# Patient Record
Sex: Female | Born: 1970 | Race: White | Hispanic: No | Smoking: Never smoker
Health system: Southern US, Community
[De-identification: ages and names within clinical notes are randomized; demographics above are authoritative.]

## PROBLEM LIST (undated history)

## (undated) DIAGNOSIS — N83209 Unspecified ovarian cyst, unspecified side: Secondary | ICD-10-CM

## (undated) DIAGNOSIS — R51 Headache: Secondary | ICD-10-CM

## (undated) DIAGNOSIS — M797 Fibromyalgia: Secondary | ICD-10-CM

## (undated) DIAGNOSIS — K8020 Calculus of gallbladder without cholecystitis without obstruction: Secondary | ICD-10-CM

## (undated) DIAGNOSIS — M1990 Unspecified osteoarthritis, unspecified site: Secondary | ICD-10-CM

## (undated) DIAGNOSIS — J189 Pneumonia, unspecified organism: Secondary | ICD-10-CM

## (undated) DIAGNOSIS — K219 Gastro-esophageal reflux disease without esophagitis: Secondary | ICD-10-CM

## (undated) HISTORY — DX: Fibromyalgia: M79.7

## (undated) HISTORY — DX: Pneumonia, unspecified organism: J18.9

## (undated) HISTORY — DX: Calculus of gallbladder without cholecystitis without obstruction: K80.20

## (undated) HISTORY — DX: Unspecified osteoarthritis, unspecified site: M19.90

## (undated) HISTORY — DX: Gastro-esophageal reflux disease without esophagitis: K21.9

---

## 2000-02-04 ENCOUNTER — Inpatient Hospital Stay (HOSPITAL_COMMUNITY): Admission: AD | Admit: 2000-02-04 | Discharge: 2000-02-06 | Payer: Self-pay | Admitting: *Deleted

## 2000-04-21 ENCOUNTER — Other Ambulatory Visit: Admission: RE | Admit: 2000-04-21 | Discharge: 2000-04-21 | Payer: Self-pay | Admitting: Obstetrics and Gynecology

## 2003-05-19 ENCOUNTER — Emergency Department (HOSPITAL_COMMUNITY): Admission: EM | Admit: 2003-05-19 | Discharge: 2003-05-19 | Payer: Self-pay | Admitting: *Deleted

## 2005-01-19 ENCOUNTER — Other Ambulatory Visit: Admission: RE | Admit: 2005-01-19 | Discharge: 2005-01-19 | Payer: Self-pay | Admitting: Obstetrics and Gynecology

## 2009-10-16 ENCOUNTER — Encounter: Admission: RE | Admit: 2009-10-16 | Discharge: 2009-10-16 | Payer: Self-pay | Admitting: Obstetrics and Gynecology

## 2009-10-28 ENCOUNTER — Encounter: Admission: RE | Admit: 2009-10-28 | Discharge: 2009-10-28 | Payer: Self-pay | Admitting: Obstetrics and Gynecology

## 2010-08-15 ENCOUNTER — Ambulatory Visit (HOSPITAL_COMMUNITY): Admission: RE | Admit: 2010-08-15 | Discharge: 2010-08-15 | Payer: Self-pay | Admitting: Family Medicine

## 2010-08-15 ENCOUNTER — Encounter: Payer: Self-pay | Admitting: Orthopedic Surgery

## 2010-09-22 ENCOUNTER — Encounter (INDEPENDENT_AMBULATORY_CARE_PROVIDER_SITE_OTHER): Payer: Self-pay | Admitting: *Deleted

## 2010-09-22 ENCOUNTER — Ambulatory Visit: Payer: Self-pay | Admitting: Orthopedic Surgery

## 2010-09-22 ENCOUNTER — Telehealth: Payer: Self-pay | Admitting: Orthopedic Surgery

## 2010-09-22 DIAGNOSIS — M542 Cervicalgia: Secondary | ICD-10-CM

## 2010-09-24 ENCOUNTER — Encounter
Admission: RE | Admit: 2010-09-24 | Discharge: 2010-12-02 | Payer: Self-pay | Source: Home / Self Care | Attending: Orthopedic Surgery | Admitting: Orthopedic Surgery

## 2010-11-05 ENCOUNTER — Encounter: Payer: Self-pay | Admitting: Orthopedic Surgery

## 2010-11-12 ENCOUNTER — Encounter (INDEPENDENT_AMBULATORY_CARE_PROVIDER_SITE_OTHER): Payer: Self-pay | Admitting: *Deleted

## 2010-11-25 ENCOUNTER — Ambulatory Visit: Payer: Self-pay | Admitting: Orthopedic Surgery

## 2010-11-25 DIAGNOSIS — M5020 Other cervical disc displacement, unspecified cervical region: Secondary | ICD-10-CM

## 2010-12-02 ENCOUNTER — Encounter (INDEPENDENT_AMBULATORY_CARE_PROVIDER_SITE_OTHER): Payer: Self-pay | Admitting: *Deleted

## 2010-12-04 ENCOUNTER — Ambulatory Visit (HOSPITAL_COMMUNITY)
Admission: RE | Admit: 2010-12-04 | Discharge: 2010-12-04 | Payer: Self-pay | Source: Home / Self Care | Attending: Orthopedic Surgery | Admitting: Orthopedic Surgery

## 2010-12-10 ENCOUNTER — Ambulatory Visit
Admission: RE | Admit: 2010-12-10 | Discharge: 2010-12-10 | Payer: Self-pay | Source: Home / Self Care | Attending: Orthopedic Surgery | Admitting: Orthopedic Surgery

## 2010-12-10 DIAGNOSIS — M47812 Spondylosis without myelopathy or radiculopathy, cervical region: Secondary | ICD-10-CM | POA: Insufficient documentation

## 2010-12-28 ENCOUNTER — Encounter: Payer: Self-pay | Admitting: Obstetrics and Gynecology

## 2011-01-08 NOTE — Miscellaneous (Signed)
Summary: mri c spine aph 12/04/10 reg 430pm  Clinical Lists Changes  no precert needed for [facility], pt to bring disc for fu appt on 12/10/10, pt aware

## 2011-01-08 NOTE — Miscellaneous (Signed)
Summary: PT initial evaluation  PT initial evaluation   Imported By: Jacklynn Ganong 11/06/2010 09:16:00  _____________________________________________________________________  External Attachment:    Type:   Image     Comment:   External Document

## 2011-01-08 NOTE — Assessment & Plan Note (Signed)
Summary: reck after pt/medcost/bsf   Visit Type:  Follow-up Referring Connie Lasater:  Robbie Lis Primary Deneka Greenwalt:  Robbie Lis medical  CC:  recheck neck.  History of Present Illness: 40 year old  female here for followup. [id number] possible herniated disc  Treatment ibuprofen 803 times a day, steroid Dosepak physical therapy  Imaging cervical spine x-ray September 9 of 2011 at the local hospital showed no cervical spine disease just loss of lordosis  Patient notes continued pain at the mid cervical spine with mild improvement in the related symptoms from physical therapy  Today scheduled for reevaluation after therapy  Review of systems when the patient is filing the long finger and ring finger will go numb on occasion.  She also has bilateral shoulder pain and bilateral knee pain which she relates to her arthritis  Physical examination reveals improved range of motion in the cervical spine with tenderness continue at the C4-C5 level.       Allergies: No Known Drug Allergies   Impression & Recommendations:  Problem # 1:  NECK PAIN (ICD-723.1) Assessment Unchanged  Orders: Est. 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 Patient Level III (21308)  Patient Instructions: 1)  return with results after MRI   Orders Added: 1)  Est. Patient Level III [65784]

## 2011-01-08 NOTE — Assessment & Plan Note (Signed)
Summary: NECK PAIN XR AT [facility] 08/15/10/[facility]/J SUAREZ/BSF   Vital Signs:  Patient profile:   40 year old female Height:      64.5 inches Weight:      285 pounds  Visit Type:  new patient Referring Provider:  Robbie Lis Primary Provider:  Robbie Lis medical  CC:  neck pain.  History of Present Illness: I saw Kathleen Massey in the office today for an initial visit.  She is a 40 years old woman with the complaint of:  neck pain.  Meds: Advil cold and sinus,Robaxin during day, Flexeril at night.  Has also had Steroid dose pak.  No PT.  C spine xrays 08/15/10 [facility] for review.  This is a 40 year old female with severe pain at the base of her cervical spine radiating down both sides of her neck and hurting now in the upper side of her RIGHT posterior shoulder with pain when she coughs or sneezes for the last 8 months  Pain is constant pain is 6/10 pain hurts all day and night pain came on suddenly.  Pain improved ibuprofen ice and heat worse with stretching and flexing laterally and rolling over in bed.  Previous treatment included steroids Robaxin Flexeril.  No physical therapy has been ordered or done.    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 Cardiovascular:  Denies chest pain, palpitations, fainting, and murmurs. Respiratory:  Denies short of breath, wheezing, couch, tightness, pain on inspiration, and snoring . Gastrointestinal:  Denies heartburn, nausea, vomiting, diarrhea, constipation, and blood in your stools. Genitourinary:  Denies frequency, urgency, difficulty urinating, painful urination, flank pain, and bleeding in  urine. Neurologic:  Denies numbness, tingling, unsteady gait, dizziness, tremors, and seizure. Musculoskeletal:  Complains of joint pain and muscle pain; denies swelling, instability, stiffness, redness, and heat. Endocrine:  Denies excessive thirst, exessive urination, and heat or cold intolerance. Psychiatric:  Denies nervousness, depression, anxiety, and hallucinations. Skin:  Denies changes in the skin, poor healing, rash, itching, and redness. HEENT:  Denies blurred or double vision, eye pain, redness, and watering. Immunology:  Complains of seasonal allergies; denies sinus problems and allergic to bee stings. Hemoatologic:  Denies easy bleeding and brusing.  Physical Exam  Additional Exam:  Slightly overweight female with normal development grooming and hygiene.  Vital signs as recorded stable.  Cardiovascular exam reveals no abnormalities on observational palpation  Lymph nodes are negative in the cervical spine  Skin of the shoulders neck normal  She is normal sensation normal reflexes normal coordination no pathologic signs.  She's awake alert and oriented x3 mood and affect are normal  Ambulation is normal  She does have tenderness at the base of the cervical spine and in the trapeze use muscles.  She has mild stiffness on flexion-extension rotation and lateral flexion  Her extremities have 5 over 5 strength in all muscles.  Her neck and shoulders are stable.  She has negative impingement sign and no apprehension.     Impression & Recommendations:  Problem # 1:  NECK PAIN (ICD-723.1) Assessment New previous x-rays including a cervical spine show no evidence of disc disease joint space [place] but there is some reversal of the cervical orthosis  in the mid to upper cervical spine suggesting muscle spasm.  This was done September of this year.   her exam is negative except for some stiffness.  I suggest she continue her medications and then come back for followup  evaluation after some physical therapy  Orders: Physical Therapy Referral (PT) New Patient Level III (45409)  Patient Instructions: 1)  Go for PT at [place] 2)  Continue medications 3)  come back in 6 weeks   Orders Added: 1)  Physical Therapy Referral [PT] 2)  New Patient Level III [81191]

## 2011-01-08 NOTE — Letter (Signed)
Summary: *Orthopedic No Show Letter  [facility]  [address]  [place], [place] [address]   Phone: [phone]  Fax: [phone]    11/12/2010     Kathleen Massey [address] [place], [place]       Dear Ms. Fuerstenberg,   Our records indicate that you missed your scheduled appointment with Dr. Beaulah Corin on November 11,2011.  Please contact this office to reschedule your appointment as soon as possible.  It is important that you keep your scheduled appointments with your physician, so we can provide you the best care possible.        Sincerely,   Dr. Terrance Mass, MD [facility] Phone [phone]

## 2011-01-08 NOTE — Letter (Signed)
Summary: Out of Work  [facility]  [address]  [place], [place] [address]   Phone: [phone]  Fax: [phone]    September 22, 2010   Employee:  EVELISSE SZALKOWSKI    To Whom It May Concern:   For Medical reasons, please excuse the above named employee from work for the following dates:  Start:   09/22/10          Patient was seen in our office today for an afternoon appointment  End/Return to full-duty work:   09/23/10  If you need additional information, please feel free to contact our office.         Sincerely,    Terrance Mass, MD

## 2011-01-08 NOTE — Letter (Signed)
Summary: *Orthopedic No Show Letter  [facility]  [address]  [place], [place] [address]   Phone: [phone]  Fax: [phone]    11/12/2010   Dear Ms. Hartel,   Our records indicate that you missed your scheduled appointment with Dr. Beaulah Corin on October 17 2010.  Please contact this office to reschedule your appointment as soon as possible.  It is important that you keep your scheduled appointments with your physician, so we can provide you the best care possible.        Sincerely,   Dr. Terrance Mass, MD [facility] Phone [phone]

## 2011-01-08 NOTE — Assessment & Plan Note (Signed)
Summary: mri results c spine/to bring disc/[facility].cbt   Visit Type:  Follow-up Referring Provider:  Robbie Lis Primary Provider:  Robbie Lis medical  CC:  mri results c spine.  History of Present Illness: 40 year old  female here for followup. [id number] possible herniated disc  Treatment ibuprofen 800 3 times a day, steroid Dosepak, physical therapy  Other medications include Robaxin and Flexeril the Flexeril seemed to work better but it makes her very drowsy so we are only taken at night.  10 mg Flexeril q.h.s.11  Imaging cervical spine x-ray September 9 of 2011 at the local hospital showed no cervical spine disease just loss of lordosis  Patient notes continued pain at the mid cervical spine with mild improvement in the related symptoms from physical therapy  Today is recheck and MRI results taken [facility] 12/04/10 C spine.  Pain level is the same, Pain around 3 today.  IMPRESSION: Mild degenerative changes.  Negative for disc protrusion or significant stenosis.          Allergies: No Known Drug Allergies  Review of Systems       occasional numbness tingling and after extremity seems to be related to activity such as filing   Impression & Recommendations:  Problem # 1:  NECK PAIN (ICD-723.1) Assessment Unchanged The MRI was done at [facility] and it was reviewed with the report    After reviewing this with the patient and giving her [employer] cervical spondylosis information sheet we decided to try some massage therapy for the neck and shoulders, continue physical therapy, continue Flexeril and ibuprofen followup one month.  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 Patient Level III (11914)  Patient Instructions: 1)  Please schedule a follow-up appointment in 1 month.   Orders Added: 1)  Est. Patient Level III [78295]

## 2011-01-08 NOTE — Progress Notes (Signed)
Summary: fax# to follow to send work note if needed  Phone Note Call from Patient   Caller: Patient Summary of Call: Patient will call back w/fax # for work note to be sent to her direct fax. Initial call taken by: Cammie Sickle,  September 22, 2010 4:20 PM

## 2011-01-08 NOTE — Miscellaneous (Signed)
Summary: Physical therapy order [facility] [address]  Physical therapy order [facility] [address]   Imported By: Cammie Sickle 09/22/2010 18:14:19  _____________________________________________________________________  External Attachment:    Type:   Image     Comment:   External Document

## 2011-01-08 NOTE — Miscellaneous (Signed)
Summary: PT progress note  PT progress note   Imported By: Jacklynn Ganong 11/26/2010 07:46:17  _____________________________________________________________________  External Attachment:    Type:   Image     Comment:   External Document

## 2011-01-08 NOTE — Letter (Signed)
Summary: History form  History form   Imported By: Jacklynn Ganong 10/01/2010 15:24:10  _____________________________________________________________________  External Attachment:    Type:   Image     Comment:   External Document

## 2011-01-20 ENCOUNTER — Telehealth: Payer: Self-pay | Admitting: Orthopedic Surgery

## 2011-01-21 ENCOUNTER — Ambulatory Visit: Payer: Self-pay | Admitting: Orthopedic Surgery

## 2011-01-28 NOTE — Progress Notes (Signed)
Summary: patient needs to cancel appointment  Phone Note Call from Patient   Caller: Patient Summary of Call: Patient called to first cancel tomorrow's appointment (01/21/11) due to work schedule.  States neck and back feeling much better, "basically pain-free" with taking medication as instructed.  Elected to hold on re-scheduling appointment; asking if needs to re-schedule at all.  Please advise. Initial call taken by: Cammie Sickle,  January 20, 2011 11:10 AM  Follow-up for Phone Call        if she needs [place] call us Follow-up by: Ether Griffins,  January 20, 2011 11:19 AM  Additional Follow-up for Phone Call Additional follow up Details #1::        called patient. (had tried work ph# earlier, not at work today. called home ph#; left voice message to return call to advise. Additional Follow-up by: Cammie Sickle,  January 20, 2011 3:32 PM

## 2011-07-09 ENCOUNTER — Other Ambulatory Visit: Payer: Self-pay | Admitting: Obstetrics and Gynecology

## 2011-07-09 DIAGNOSIS — Z1231 Encounter for screening mammogram for malignant neoplasm of breast: Secondary | ICD-10-CM

## 2011-07-21 ENCOUNTER — Ambulatory Visit: Payer: PRIVATE HEALTH INSURANCE

## 2012-04-12 ENCOUNTER — Emergency Department (HOSPITAL_COMMUNITY)
Admission: EM | Admit: 2012-04-12 | Discharge: 2012-04-12 | Disposition: A | Discharge status: Home / Self Care | Payer: PRIVATE HEALTH INSURANCE | Attending: Emergency Medicine | Admitting: Emergency Medicine

## 2012-04-12 ENCOUNTER — Encounter (HOSPITAL_COMMUNITY): Payer: Self-pay

## 2012-04-12 DIAGNOSIS — R079 Chest pain, unspecified: Secondary | ICD-10-CM | POA: Insufficient documentation

## 2012-04-12 DIAGNOSIS — R51 Headache: Secondary | ICD-10-CM | POA: Insufficient documentation

## 2012-04-12 MED ORDER — ONDANSETRON HCL 4 MG/2ML IJ SOLN
4.0000 mg | Freq: Once | INTRAMUSCULAR | Status: AC
  Administered 2012-04-12: 4 mg via INTRAVENOUS
  Filled 2012-04-12: qty 2

## 2012-04-12 MED ORDER — KETOROLAC TROMETHAMINE 30 MG/ML IJ SOLN
30.0000 mg | Freq: Once | INTRAMUSCULAR | Status: AC
  Administered 2012-04-12: 30 mg via INTRAVENOUS
  Filled 2012-04-12: qty 1

## 2012-04-12 MED ORDER — METOCLOPRAMIDE HCL 5 MG/ML IJ SOLN
10.0000 mg | Freq: Once | INTRAMUSCULAR | Status: AC
  Administered 2012-04-12: 10 mg via INTRAVENOUS
  Filled 2012-04-12: qty 2

## 2012-04-12 MED ORDER — SODIUM CHLORIDE 0.9 % IV BOLUS (SEPSIS)
1000.0000 mL | Freq: Once | INTRAVENOUS | Status: AC
  Administered 2012-04-12: 1000 mL via INTRAVENOUS

## 2012-04-12 NOTE — ED Notes (Signed)
Pt c/o severe headache x 3 days.  Reports substernal chest pain going through to back since this am.   Pt went to PCP's office at 1230 and chest pain started going away.  Dr. Phillips Odor reports pt's bp was 135 /98 and  143/107 in the office.  Says pt is usually normotensive.

## 2012-04-12 NOTE — Discharge Instructions (Signed)
Followup your primary care Dr.      Raelene Bott a blood pressure log to analyze the trend of your blood pressure.  No blood pressure medication recommended at this point

## 2012-04-12 NOTE — ED Provider Notes (Signed)
History     CSN: [id number]  Arrival date & time 04/12/12  1356   First MD Initiated Contact with Patient 04/12/12 1401      Chief Complaint  Patient presents with  . Headache  . Chest Pain    (Consider location/radiation/quality/duration/timing/severity/associated sxs/prior treatment) HPI....Marland Kitchen complains of frontal headache radiating to the occipital area for 2-3 days. No neurological deficits, fever, chills, stiff neck.  Additionally patient had fleeting chest pain. She went to her primary care Dr. today and her blood pressure was 143/107.  He states it is normally normal.  No dyspnea, nausea, diaphoresis. Nothing makes symptoms better or worse. Described as minimal to moderate  History reviewed. No pertinent past medical history.  Past Surgical History  Procedure Date  . Cesarean section     No family history on file.  History  Substance Use Topics  . Smoking status: Never Smoker   . Smokeless tobacco: Not on file  . Alcohol Use: No    OB History    Grav Para Term Preterm Abortions TAB SAB Ect Mult Living                  Review of Systems  All other systems reviewed and are negative.    Allergies  Review of patient's allergies indicates no known allergies.  Home Medications  No current outpatient prescriptions on file.  LMP 04/02/2012  Physical Exam  Nursing note and vitals reviewed. Constitutional: She is oriented to person, place, and time. She appears well-developed and well-nourished.       Obese  HENT:  Head: Normocephalic and atraumatic.  Eyes: Conjunctivae and EOM are normal. Pupils are equal, round, and reactive to light.  Neck: Normal range of motion. Neck supple.  Cardiovascular: Normal rate and regular rhythm.   Pulmonary/Chest: Effort normal and breath sounds normal.  Abdominal: Soft. Bowel sounds are normal.  Musculoskeletal: Normal range of motion.  Neurological: She is alert and oriented to person, place, and time.  Skin: Skin is warm  and dry.  Psychiatric: She has a normal mood and affect.    ED Course  Procedures (including critical care time)  Labs Reviewed - No data to display No results found.   No diagnosis found.   Date: 04/12/2012  Rate: 75  Rhythm: normal sinus rhythm  QRS Axis: normal  Intervals: normal  ST/T Wave abnormalities: normal  Conduction Disutrbances: none  Narrative Interpretation: unremarkable     MDM  Patient is normal physical exam. Feeling much better after IV fluids, IV Toradol, Zofran, Reglan. Can followup with primary care        Donnetta Hutching, MD 04/12/12 [phone]

## 2012-12-14 ENCOUNTER — Inpatient Hospital Stay (HOSPITAL_COMMUNITY)
Admission: AD | Admit: 2012-12-14 | Discharge: 2012-12-14 | Disposition: A | Discharge status: Home / Self Care | Payer: PRIVATE HEALTH INSURANCE | Source: Ambulatory Visit | Attending: Obstetrics and Gynecology | Admitting: Obstetrics and Gynecology

## 2012-12-14 ENCOUNTER — Encounter (HOSPITAL_COMMUNITY): Payer: Self-pay | Admitting: Advanced Practice Midwife

## 2012-12-14 DIAGNOSIS — N938 Other specified abnormal uterine and vaginal bleeding: Secondary | ICD-10-CM | POA: Insufficient documentation

## 2012-12-14 DIAGNOSIS — N949 Unspecified condition associated with female genital organs and menstrual cycle: Secondary | ICD-10-CM | POA: Insufficient documentation

## 2012-12-14 HISTORY — DX: Headache: R51

## 2012-12-14 HISTORY — DX: Unspecified ovarian cyst, unspecified side: N83.209

## 2012-12-14 LAB — CBC
HCT: 39.7 % (ref 36.0–46.0)
MCH: 30.8 pg (ref 26.0–34.0)
MCHC: 34 g/dL (ref 30.0–36.0)
MCV: 90.6 fL (ref 78.0–100.0)
Platelets: 324 10*3/uL (ref 150–400)
RBC: 4.38 MIL/uL (ref 3.87–5.11)
RDW: 12.6 % (ref 11.5–15.5)
WBC: 7.9 10*3/uL (ref 4.0–10.5)

## 2012-12-14 LAB — POCT PREGNANCY, URINE: Preg Test, Ur: NEGATIVE

## 2012-12-14 LAB — URINE MICROSCOPIC-ADD ON

## 2012-12-14 LAB — URINALYSIS, ROUTINE W REFLEX MICROSCOPIC
Bilirubin Urine: NEGATIVE
Specific Gravity, Urine: 1.015 (ref 1.005–1.030)

## 2012-12-14 LAB — WET PREP, GENITAL: Clue Cells Wet Prep HPF POC: NONE SEEN

## 2012-12-14 NOTE — MAU Provider Note (Signed)
History     CSN: [id number]  Arrival date and time: 12/14/12 1713   None     Chief Complaint  Patient presents with  . Vaginal Bleeding   HPI 42 y.o. [id number] with heavy vaginal bleeding x 12 days. No menstrual cycle x 3 months prior to this. Periods have been irregular this year, but not usually heavy. Also c/o headaches.    Past Medical History  Diagnosis Date  . Ovarian cyst   . Headache     Past Surgical History  Procedure Date  . Cesarean section     Family History  Problem Relation Age of Onset  . Other Neg Hx     History  Substance Use Topics  . Smoking status: Never Smoker   . Smokeless tobacco: Not on file  . Alcohol Use: No    Allergies: No Known Allergies  No prescriptions prior to admission    Review of Systems  Constitutional: Negative.   Respiratory: Negative.   Cardiovascular: Negative.   Gastrointestinal: Negative for nausea, vomiting, abdominal pain, diarrhea and constipation.  Genitourinary: Negative for dysuria, urgency, frequency, hematuria and flank pain.       Positive bleeding   Musculoskeletal: Negative.   Neurological: Positive for headaches.  Psychiatric/Behavioral: Negative.    Physical Exam   Blood pressure 127/76, pulse 75, temperature 97.7 F (36.5 C), temperature source Oral, resp. rate 16, height 5' 5.5" (1.664 m), weight 278 lb (126.1 kg), last menstrual period 12/02/2012.  Physical Exam  Nursing note and vitals reviewed. Constitutional: She is oriented to person, place, and time. She appears well-developed and well-nourished. No distress.       Obese   Cardiovascular: Normal rate.   Respiratory: Effort normal.  Genitourinary: There is no rash, tenderness or lesion on the right labia. There is no rash, tenderness or lesion on the left labia. Uterus is not enlarged and not tender. Cervix exhibits no motion tenderness and no friability. Right adnexum displays no mass, no tenderness and no fullness. Left adnexum  displays no mass, no tenderness and no fullness. There is bleeding (moderate) around the vagina.       Exam limited by body habitus   Musculoskeletal: Normal range of motion.  Neurological: She is alert and oriented to person, place, and time.  Skin: Skin is warm and dry.  Psychiatric: She has a normal mood and affect.    MAU Course  Procedures  Results for orders placed during the hospital encounter of 12/14/12 (from the past 24 hour(s))  URINALYSIS, ROUTINE W REFLEX MICROSCOPIC     Status: Abnormal   Collection Time   12/14/12  5:25 PM      Component Value Range   Color, Urine YELLOW  YELLOW   APPearance CLOUDY (*) CLEAR   Specific Gravity, Urine 1.015  1.005 - 1.030   pH 6.5  5.0 - 8.0   Glucose, UA NEGATIVE  NEGATIVE mg/dL   Hgb urine dipstick LARGE (*) NEGATIVE   Bilirubin Urine NEGATIVE  NEGATIVE   Ketones, ur NEGATIVE  NEGATIVE mg/dL   Protein, ur NEGATIVE  NEGATIVE mg/dL   Urobilinogen, UA 0.2  0.0 - 1.0 mg/dL   Nitrite NEGATIVE  NEGATIVE   Leukocytes, UA TRACE (*) NEGATIVE  URINE MICROSCOPIC-ADD ON     Status: Abnormal   Collection Time   12/14/12  5:25 PM      Component Value Range   Squamous Epithelial / LPF FEW (*) RARE   WBC, UA 0-2  <3 WBC/hpf  RBC / HPF TOO NUMEROUS TO COUNT  <3 RBC/hpf   Bacteria, UA FEW (*) RARE  POCT PREGNANCY, URINE     Status: Normal   Collection Time   12/14/12  5:37 PM      Component Value Range   Preg Test, Ur NEGATIVE  NEGATIVE  WET PREP, GENITAL     Status: Abnormal   Collection Time   12/14/12  6:00 PM      Component Value Range   Yeast Wet Prep HPF POC NONE SEEN  NONE SEEN   Trich, Wet Prep NONE SEEN  NONE SEEN   Clue Cells Wet Prep HPF POC NONE SEEN  NONE SEEN   WBC, Wet Prep HPF POC FEW (*) NONE SEEN  CBC     Status: Normal   Collection Time   12/14/12  6:15 PM      Component Value Range   WBC 7.9  4.0 - 10.5 K/uL   RBC 4.38  3.87 - 5.11 MIL/uL   Hemoglobin 13.5  12.0 - 15.0 g/dL   HCT 16.1  09.6 - 04.5 %   MCV 90.6  78.0  - 100.0 fL   MCH 30.8  26.0 - 34.0 pg   MCHC 34.0  30.0 - 36.0 g/dL   RDW 40.9  81.1 - 91.4 %   Platelets 324  150 - 400 K/uL     Assessment and Plan   1. DUB (dysfunctional uterine bleeding)       Medication List     As of 12/14/2012  7:06 PM    CONTINUE taking these medications         ZANTAC 150 MG tablet   Generic drug: ranitidine      ASK your doctor about these medications         ALPRAZolam 0.5 MG tablet   Commonly known as: XANAX      diclofenac 75 MG EC tablet   Commonly known as: VOLTAREN      ibuprofen 200 MG tablet   Commonly known as: ADVIL,MOTRIN            Follow-up Information    Follow up with Levi Aland, MD. (as scheduled on Friday)    Contact information:   [address] [place] [address] [phone]            Colisha Redler 12/14/2012, 7:04 PM

## 2012-12-14 NOTE — MAU Note (Addendum)
C/o missed 3 periods but now has been bleeding for 15 days; hx of ovarian cyst;

## 2012-12-15 LAB — GC/CHLAMYDIA PROBE AMP: GC Probe RNA: NEGATIVE

## 2013-03-04 ENCOUNTER — Inpatient Hospital Stay (HOSPITAL_COMMUNITY)
Admission: AD | Admit: 2013-03-04 | Discharge: 2013-03-05 | Disposition: A | Discharge status: Home / Self Care | Payer: PRIVATE HEALTH INSURANCE | Source: Ambulatory Visit | Attending: Obstetrics and Gynecology | Admitting: Obstetrics and Gynecology

## 2013-03-04 DIAGNOSIS — R1032 Left lower quadrant pain: Secondary | ICD-10-CM

## 2013-03-04 DIAGNOSIS — R109 Unspecified abdominal pain: Secondary | ICD-10-CM | POA: Insufficient documentation

## 2013-03-04 DIAGNOSIS — N920 Excessive and frequent menstruation with regular cycle: Secondary | ICD-10-CM | POA: Insufficient documentation

## 2013-03-04 DIAGNOSIS — N949 Unspecified condition associated with female genital organs and menstrual cycle: Secondary | ICD-10-CM | POA: Insufficient documentation

## 2013-03-04 DIAGNOSIS — K219 Gastro-esophageal reflux disease without esophagitis: Secondary | ICD-10-CM | POA: Insufficient documentation

## 2013-03-04 DIAGNOSIS — N926 Irregular menstruation, unspecified: Secondary | ICD-10-CM | POA: Insufficient documentation

## 2013-03-05 ENCOUNTER — Encounter (HOSPITAL_COMMUNITY): Payer: Self-pay | Admitting: Obstetrics and Gynecology

## 2013-03-05 DIAGNOSIS — R1032 Left lower quadrant pain: Secondary | ICD-10-CM

## 2013-03-05 LAB — URINALYSIS, ROUTINE W REFLEX MICROSCOPIC
Leukocytes, UA: NEGATIVE
Nitrite: NEGATIVE
pH: 5 (ref 5.0–8.0)

## 2013-03-05 LAB — URINE MICROSCOPIC-ADD ON

## 2013-03-05 LAB — POCT PREGNANCY, URINE: Preg Test, Ur: NEGATIVE

## 2013-03-05 MED ORDER — IBUPROFEN 600 MG PO TABS: 600.0000 mg | ORAL_TABLET | Freq: Four times a day (QID) | ORAL | Status: DC | PRN

## 2013-03-05 NOTE — MAU Note (Signed)
Severe lower abdominal pain onset around 11:30 p.m, no N/V/D, denies vaginal bleeding, no dysuria, no constipation, history of ovarian cysts.

## 2013-03-05 NOTE — MAU Note (Signed)
Patient states that she does have periods but cannot tell me when her LMP was.

## 2013-03-05 NOTE — MAU Provider Note (Signed)
CC: Abdominal Pain    First Provider Initiated Contact with Patient 03/05/13 0048      HPI Kathleen Massey is a 42 y.o. [id number] who presents with abrupt onset at rest of left suprapubic pain about 30 minutes PTA. She describes it as feeling like something sharp scraping her insides and initially was doubled over in pain. At present she is having minimal discomfort. She took 3 Advils before coming in. Denies similar previous episodes although she did have a painful ovarian cyst in the past. She was seen here 2 months ago for irregular menses and an episode of menorrhagia. Cultures were negative. After that visit she was seen in the office and had a normal ultrasound. She was put on oral contraceptives to regulate her cycles however she self DC'd about 5 weeks ago due to headaches. Has had one normal period since then, but unsure where she is in her cycle now.  Past Medical History  Diagnosis Date  . Ovarian cyst   . Headache     OB History   Grav Para Term Preterm Abortions TAB SAB Ect Mult Living   4 3 3  1  1   3      # Outc Date GA Lbr Len/2nd Wgt Sex Del Anes PTL Lv   1 SAB            2 TRM            3 TRM            4 TRM               Past Surgical History  Procedure Laterality Date  . Cesarean section      History   Social History  . Marital Status: Married    Spouse Name: N/A    Number of Children: N/A  . Years of Education: 12 grade    Occupational History  . secretary/ receptionist     Social History Main Topics  . Smoking status: Never Smoker   . Smokeless tobacco: Not on file  . Alcohol Use: No  . Drug Use: No  . Sexually Active: Not on file   Other Topics Concern  . Not on file   Social History Narrative  . No narrative on file    No current facility-administered medications on file prior to encounter.   Current Outpatient Prescriptions on File Prior to Encounter  Medication Sig Dispense Refill  . ALPRAZolam (XANAX) 0.5 MG tablet Take 0.5 mg by  mouth at bedtime as needed.      . diclofenac (VOLTAREN) 75 MG EC tablet Take 75 mg by mouth 2 (two) times daily.      Marland Kitchen ibuprofen (ADVIL,MOTRIN) 200 MG tablet Take 800 mg by mouth every 6 (six) hours as needed.      . ranitidine (ZANTAC) 150 MG tablet Take 150 mg by mouth daily as needed. For acid reflux        No Known Allergies  ROS Bowel movements are normal. Denies flatulence, nausea, vomiting, constipation or diarrhea.  Denies dysuria, hematuria, frequency or urgency of urination. Denies irritative vaginal discharge.   PHYSICAL EXAM Filed Vitals:   03/05/13 0032  BP: 127/64  Pulse: 80  Temp: 98 F (36.7 C)  Resp: 18   General: Well nourished, well developed female in no acute distress Cardiovascular: Normal rate Respiratory: Normal effort Abdomen: Soft, nondistended, nontender throughout to deep palpation. No guarding or rebound. Back: No CVAT Extremities: No edema Neurologic: Alert  and oriented Bimanual exam:No CMT; uterus NSSP; no adnexal tenderness or masses  LAB RESULTS Results for orders placed during the hospital encounter of 03/04/13 (from the past 24 hour(s))  URINALYSIS, ROUTINE W REFLEX MICROSCOPIC     Status: Abnormal   Collection Time    03/05/13 12:10 AM      Result Value Range   Color, Urine YELLOW  YELLOW   APPearance CLEAR  CLEAR   Specific Gravity, Urine >1.030 (*) 1.005 - 1.030   pH 5.0  5.0 - 8.0   Glucose, UA NEGATIVE  NEGATIVE mg/dL   Hgb urine dipstick SMALL (*) NEGATIVE   Bilirubin Urine NEGATIVE  NEGATIVE   Ketones, ur NEGATIVE  NEGATIVE mg/dL   Protein, ur NEGATIVE  NEGATIVE mg/dL   Urobilinogen, UA 0.2  0.0 - 1.0 mg/dL   Nitrite NEGATIVE  NEGATIVE   Leukocytes, UA NEGATIVE  NEGATIVE  URINE MICROSCOPIC-ADD ON     Status: None   Collection Time    03/05/13 12:10 AM      Result Value Range   Squamous Epithelial / LPF RARE  RARE   WBC, UA 0-2  <3 WBC/hpf   RBC / HPF 0-2  <3 RBC/hpf   Bacteria, UA RARE  RARE   Urine-Other MUCOUS  PRESENT    POCT PREGNANCY, URINE     Status: None   Collection Time    03/05/13 12:22 AM      Result Value Range   Preg Test, Ur NEGATIVE  NEGATIVE    IMAGING No results found.  MAU COURSE Remained essentially pain-free  ASSESSMENT  No diagnosis found.  PLAN Discharge home. See AVS for patient education.    Medication List    STOP taking these medications       diclofenac 75 MG EC tablet  Commonly known as:  VOLTAREN      TAKE these medications       ALPRAZolam 0.5 MG tablet  Commonly known as:  XANAX  Take 0.5 mg by mouth at bedtime as needed.     ibuprofen 600 MG tablet  Commonly known as:  ADVIL,MOTRIN  Take 1 tablet (600 mg total) by mouth every 6 (six) hours as needed for pain.     ZANTAC 150 MG tablet  Generic drug:  ranitidine  Take 150 mg by mouth daily as needed. For acid reflux        Follow-up Information   Schedule an appointment as soon as possible for a visit with HORVATH,MICHELLE A, MD. (As needed)    Contact information:   [address] [place] [address] [phone]       Danae Orleans, CNM 03/05/2013 12:51 AM

## 2013-10-20 ENCOUNTER — Other Ambulatory Visit (HOSPITAL_COMMUNITY): Payer: Self-pay | Admitting: Family Medicine

## 2013-10-20 ENCOUNTER — Ambulatory Visit (HOSPITAL_COMMUNITY)
Admission: RE | Admit: 2013-10-20 | Discharge: 2013-10-20 | Disposition: A | Discharge status: Home / Self Care | Payer: PRIVATE HEALTH INSURANCE | Source: Ambulatory Visit | Attending: Family Medicine | Admitting: Family Medicine

## 2013-10-20 DIAGNOSIS — M549 Dorsalgia, unspecified: Secondary | ICD-10-CM | POA: Insufficient documentation

## 2013-10-20 DIAGNOSIS — M546 Pain in thoracic spine: Secondary | ICD-10-CM

## 2014-07-25 ENCOUNTER — Ambulatory Visit (HOSPITAL_COMMUNITY)
Admission: RE | Admit: 2014-07-25 | Discharge: 2014-07-25 | Disposition: A | Discharge status: Home / Self Care | Payer: 59 | Source: Ambulatory Visit | Attending: Family Medicine | Admitting: Family Medicine

## 2014-07-25 ENCOUNTER — Other Ambulatory Visit (HOSPITAL_COMMUNITY): Payer: Self-pay | Admitting: Family Medicine

## 2014-07-25 DIAGNOSIS — M546 Pain in thoracic spine: Secondary | ICD-10-CM

## 2014-07-25 DIAGNOSIS — M549 Dorsalgia, unspecified: Secondary | ICD-10-CM | POA: Insufficient documentation

## 2014-10-08 ENCOUNTER — Encounter (HOSPITAL_COMMUNITY): Payer: Self-pay | Admitting: Obstetrics and Gynecology

## 2015-08-07 ENCOUNTER — Encounter: Payer: Self-pay | Admitting: Family Medicine

## 2015-12-10 ENCOUNTER — Other Ambulatory Visit (HOSPITAL_COMMUNITY): Payer: Self-pay | Admitting: Family Medicine

## 2015-12-10 ENCOUNTER — Ambulatory Visit (HOSPITAL_COMMUNITY)
Admission: RE | Admit: 2015-12-10 | Discharge: 2015-12-10 | Disposition: A | Discharge status: Home / Self Care | Payer: 59 | Source: Ambulatory Visit | Attending: Family Medicine | Admitting: Family Medicine

## 2015-12-10 DIAGNOSIS — S93401A Sprain of unspecified ligament of right ankle, initial encounter: Secondary | ICD-10-CM | POA: Insufficient documentation

## 2015-12-10 DIAGNOSIS — W19XXXA Unspecified fall, initial encounter: Secondary | ICD-10-CM | POA: Insufficient documentation

## 2017-04-27 ENCOUNTER — Other Ambulatory Visit: Payer: Self-pay | Admitting: Obstetrics and Gynecology

## 2017-04-27 DIAGNOSIS — R928 Other abnormal and inconclusive findings on diagnostic imaging of breast: Secondary | ICD-10-CM

## 2017-04-30 ENCOUNTER — Ambulatory Visit
Admission: RE | Admit: 2017-04-30 | Discharge: 2017-04-30 | Disposition: A | Discharge status: Home / Self Care | Payer: BLUE CROSS/BLUE SHIELD | Source: Ambulatory Visit | Attending: Obstetrics and Gynecology | Admitting: Obstetrics and Gynecology

## 2017-04-30 DIAGNOSIS — R928 Other abnormal and inconclusive findings on diagnostic imaging of breast: Secondary | ICD-10-CM

## 2017-10-08 ENCOUNTER — Other Ambulatory Visit (HOSPITAL_COMMUNITY): Payer: Self-pay | Admitting: Orthopedic Surgery

## 2017-10-08 DIAGNOSIS — M25561 Pain in right knee: Secondary | ICD-10-CM

## 2017-10-13 ENCOUNTER — Ambulatory Visit (HOSPITAL_COMMUNITY)
Admission: RE | Admit: 2017-10-13 | Discharge: 2017-10-13 | Disposition: A | Discharge status: Home / Self Care | Payer: BLUE CROSS/BLUE SHIELD | Source: Ambulatory Visit | Attending: Orthopedic Surgery | Admitting: Orthopedic Surgery

## 2017-10-13 DIAGNOSIS — M25561 Pain in right knee: Secondary | ICD-10-CM | POA: Diagnosis not present

## 2018-09-09 ENCOUNTER — Ambulatory Visit: Payer: BLUE CROSS/BLUE SHIELD | Admitting: Cardiology

## 2018-12-26 ENCOUNTER — Other Ambulatory Visit: Payer: Self-pay | Admitting: Family Medicine

## 2018-12-26 DIAGNOSIS — Z1389 Encounter for screening for other disorder: Secondary | ICD-10-CM

## 2018-12-29 ENCOUNTER — Ambulatory Visit (HOSPITAL_COMMUNITY)
Admission: RE | Admit: 2018-12-29 | Discharge: 2018-12-29 | Disposition: A | Discharge status: Home / Self Care | Payer: Managed Care, Other (non HMO) | Source: Ambulatory Visit | Attending: Family Medicine | Admitting: Family Medicine

## 2018-12-29 DIAGNOSIS — Z1389 Encounter for screening for other disorder: Secondary | ICD-10-CM | POA: Diagnosis not present

## 2019-01-02 ENCOUNTER — Encounter: Payer: Self-pay | Admitting: Gastroenterology

## 2019-01-11 ENCOUNTER — Ambulatory Visit: Payer: Managed Care, Other (non HMO) | Admitting: Nurse Practitioner

## 2019-03-20 ENCOUNTER — Ambulatory Visit: Payer: Managed Care, Other (non HMO) | Admitting: Nurse Practitioner

## 2019-12-20 ENCOUNTER — Encounter (INDEPENDENT_AMBULATORY_CARE_PROVIDER_SITE_OTHER): Payer: Self-pay | Admitting: *Deleted

## 2020-09-20 ENCOUNTER — Other Ambulatory Visit (HOSPITAL_COMMUNITY): Payer: Self-pay | Admitting: Physician Assistant

## 2020-09-20 ENCOUNTER — Other Ambulatory Visit: Payer: Self-pay | Admitting: Physician Assistant

## 2020-09-20 DIAGNOSIS — R748 Abnormal levels of other serum enzymes: Secondary | ICD-10-CM

## 2020-09-21 ENCOUNTER — Other Ambulatory Visit: Payer: Self-pay

## 2020-09-21 ENCOUNTER — Inpatient Hospital Stay (HOSPITAL_COMMUNITY)
Admission: EM | Admit: 2020-09-21 | Discharge: 2020-09-24 | DRG: 177 | Disposition: A | Discharge status: Home / Self Care | Payer: 59 | Attending: Family Medicine | Admitting: Family Medicine

## 2020-09-21 ENCOUNTER — Encounter (HOSPITAL_COMMUNITY): Payer: Self-pay | Admitting: Emergency Medicine

## 2020-09-21 ENCOUNTER — Emergency Department (HOSPITAL_COMMUNITY): Payer: 59

## 2020-09-21 DIAGNOSIS — Z6841 Body Mass Index (BMI) 40.0 and over, adult: Secondary | ICD-10-CM

## 2020-09-21 DIAGNOSIS — E876 Hypokalemia: Secondary | ICD-10-CM

## 2020-09-21 DIAGNOSIS — K219 Gastro-esophageal reflux disease without esophagitis: Secondary | ICD-10-CM | POA: Diagnosis present

## 2020-09-21 DIAGNOSIS — J1282 Pneumonia due to coronavirus disease 2019: Secondary | ICD-10-CM | POA: Diagnosis present

## 2020-09-21 DIAGNOSIS — J9601 Acute respiratory failure with hypoxia: Secondary | ICD-10-CM | POA: Diagnosis present

## 2020-09-21 DIAGNOSIS — Z79899 Other long term (current) drug therapy: Secondary | ICD-10-CM

## 2020-09-21 DIAGNOSIS — J9691 Respiratory failure, unspecified with hypoxia: Secondary | ICD-10-CM | POA: Diagnosis present

## 2020-09-21 DIAGNOSIS — E440 Moderate protein-calorie malnutrition: Secondary | ICD-10-CM | POA: Diagnosis present

## 2020-09-21 DIAGNOSIS — U071 COVID-19: Secondary | ICD-10-CM | POA: Diagnosis present

## 2020-09-21 LAB — CBC WITH DIFFERENTIAL/PLATELET
Abs Immature Granulocytes: 0.02 10*3/uL (ref 0.00–0.07)
Basophils Absolute: 0 10*3/uL (ref 0.0–0.1)
Basophils Relative: 0 %
Eosinophils Absolute: 0 10*3/uL (ref 0.0–0.5)
Eosinophils Relative: 0 %
HCT: 38.1 % (ref 36.0–46.0)
Hemoglobin: 13.1 g/dL (ref 12.0–15.0)
Immature Granulocytes: 1 %
Lymphocytes Relative: 17 %
Lymphs Abs: 0.7 10*3/uL (ref 0.7–4.0)
MCH: 30.8 pg (ref 26.0–34.0)
MCHC: 34.4 g/dL (ref 30.0–36.0)
MCV: 89.4 fL (ref 80.0–100.0)
Monocytes Absolute: 0.1 10*3/uL (ref 0.1–1.0)
Monocytes Relative: 3 %
Neutro Abs: 3.4 10*3/uL (ref 1.7–7.7)
Neutrophils Relative %: 79 %
Platelets: 215 10*3/uL (ref 150–400)
RBC: 4.26 MIL/uL (ref 3.87–5.11)
RDW: 12.1 % (ref 11.5–15.5)
WBC: 4.3 10*3/uL (ref 4.0–10.5)
nRBC: 0 % (ref 0.0–0.2)

## 2020-09-21 LAB — C-REACTIVE PROTEIN: CRP: 24.5 mg/dL — ABNORMAL HIGH (ref <1.0)

## 2020-09-21 LAB — LACTIC ACID, PLASMA: Lactic Acid, Venous: 1.4 mmol/L (ref 0.5–1.9)

## 2020-09-21 LAB — COMPREHENSIVE METABOLIC PANEL
ALT: 63 U/L — ABNORMAL HIGH (ref 0–44)
AST: 84 U/L — ABNORMAL HIGH (ref 15–41)
Albumin: 3.2 g/dL — ABNORMAL LOW (ref 3.5–5.0)
Alkaline Phosphatase: 90 U/L (ref 38–126)
Anion gap: 9 (ref 5–15)
BUN: 10 mg/dL (ref 6–20)
CO2: 26 mmol/L (ref 22–32)
Calcium: 8.4 mg/dL — ABNORMAL LOW (ref 8.9–10.3)
Chloride: 98 mmol/L (ref 98–111)
Creatinine, Ser: 0.64 mg/dL (ref 0.44–1.00)
GFR, Estimated: >60 mL/min (ref >60)
Glucose, Bld: 148 mg/dL — ABNORMAL HIGH (ref 70–99)
Potassium: 3 mmol/L — ABNORMAL LOW (ref 3.5–5.1)
Sodium: 133 mmol/L — ABNORMAL LOW (ref 135–145)
Total Bilirubin: 0.7 mg/dL (ref 0.3–1.2)
Total Protein: 6.3 g/dL — ABNORMAL LOW (ref 6.5–8.1)

## 2020-09-21 LAB — LACTATE DEHYDROGENASE: LDH: 367 U/L — ABNORMAL HIGH (ref 98–192)

## 2020-09-21 LAB — FIBRINOGEN: Fibrinogen: 677 mg/dL — ABNORMAL HIGH (ref 210–475)

## 2020-09-21 LAB — D-DIMER, QUANTITATIVE: D-Dimer, Quant: 1.37 ug/mL-FEU — ABNORMAL HIGH (ref 0.00–0.50)

## 2020-09-21 LAB — RESPIRATORY PANEL BY RT PCR (FLU A&B, COVID)
Influenza A by PCR: NEGATIVE
Influenza B by PCR: NEGATIVE
SARS Coronavirus 2 by RT PCR: POSITIVE — AB

## 2020-09-21 LAB — TRIGLYCERIDES: Triglycerides: 72 mg/dL (ref <150)

## 2020-09-21 LAB — PROCALCITONIN: Procalcitonin: <0.1 ng/mL

## 2020-09-21 LAB — FERRITIN: Ferritin: 260 ng/mL (ref 11–307)

## 2020-09-21 MED ORDER — ASCORBIC ACID 500 MG PO TABS
500.0000 mg | ORAL_TABLET | Freq: Every day | ORAL | Status: DC
  Administered 2020-09-22 – 2020-09-24 (×3): 500 mg via ORAL
  Filled 2020-09-21 (×3): qty 1

## 2020-09-21 MED ORDER — ZINC SULFATE 220 (50 ZN) MG PO CAPS
220.0000 mg | ORAL_CAPSULE | Freq: Every day | ORAL | Status: DC
  Administered 2020-09-22 – 2020-09-24 (×3): 220 mg via ORAL
  Filled 2020-09-21 (×3): qty 1

## 2020-09-21 MED ORDER — POTASSIUM CHLORIDE 20 MEQ PO PACK
40.0000 meq | PACK | Freq: Once | ORAL | Status: AC
  Administered 2020-09-21: 40 meq via ORAL
  Filled 2020-09-21: qty 2

## 2020-09-21 MED ORDER — DEXAMETHASONE SODIUM PHOSPHATE 10 MG/ML IJ SOLN
6.0000 mg | INTRAMUSCULAR | Status: DC
  Administered 2020-09-22 – 2020-09-24 (×3): 6 mg via INTRAVENOUS
  Filled 2020-09-21 (×3): qty 1

## 2020-09-21 MED ORDER — DEXAMETHASONE SODIUM PHOSPHATE 10 MG/ML IJ SOLN
10.0000 mg | Freq: Once | INTRAMUSCULAR | Status: AC
  Administered 2020-09-21: 10 mg via INTRAVENOUS
  Filled 2020-09-21: qty 1

## 2020-09-21 MED ORDER — ONDANSETRON HCL 4 MG PO TABS: 4.0000 mg | ORAL_TABLET | Freq: Four times a day (QID) | ORAL | Status: DC | PRN

## 2020-09-21 MED ORDER — POLYETHYLENE GLYCOL 3350 17 G PO PACK: 17.0000 g | PACK | Freq: Every day | ORAL | Status: DC | PRN

## 2020-09-21 MED ORDER — HEPARIN SODIUM (PORCINE) 5000 UNIT/ML IJ SOLN
5000.0000 [IU] | Freq: Three times a day (TID) | INTRAMUSCULAR | Status: DC
  Administered 2020-09-21 – 2020-09-22 (×2): 5000 [IU] via SUBCUTANEOUS
  Filled 2020-09-21 (×2): qty 1

## 2020-09-21 MED ORDER — ONDANSETRON HCL 4 MG/2ML IJ SOLN: 4.0000 mg | Freq: Four times a day (QID) | INTRAMUSCULAR | Status: DC | PRN

## 2020-09-21 MED ORDER — FAMOTIDINE 20 MG PO TABS
20.0000 mg | ORAL_TABLET | Freq: Every day | ORAL | Status: DC
  Administered 2020-09-21 – 2020-09-23 (×3): 20 mg via ORAL
  Filled 2020-09-21 (×3): qty 1

## 2020-09-21 MED ORDER — IPRATROPIUM-ALBUTEROL 20-100 MCG/ACT IN AERS
1.0000 | INHALATION_SPRAY | Freq: Three times a day (TID) | RESPIRATORY_TRACT | Status: DC
  Administered 2020-09-22 – 2020-09-24 (×7): 1 via RESPIRATORY_TRACT

## 2020-09-21 MED ORDER — ACETAMINOPHEN 325 MG PO TABS
650.0000 mg | ORAL_TABLET | Freq: Four times a day (QID) | ORAL | Status: DC | PRN
  Administered 2020-09-22 – 2020-09-23 (×2): 650 mg via ORAL
  Filled 2020-09-21 (×2): qty 2

## 2020-09-21 MED ORDER — GUAIFENESIN-DM 100-10 MG/5ML PO SYRP: 10.0000 mL | ORAL_SOLUTION | ORAL | Status: DC | PRN

## 2020-09-21 MED ORDER — SODIUM CHLORIDE 0.9 % IV SOLN
100.0000 mg | INTRAVENOUS | Status: AC
  Administered 2020-09-21 (×2): 100 mg via INTRAVENOUS
  Filled 2020-09-21: qty 20

## 2020-09-21 MED ORDER — IPRATROPIUM-ALBUTEROL 20-100 MCG/ACT IN AERS
1.0000 | INHALATION_SPRAY | Freq: Four times a day (QID) | RESPIRATORY_TRACT | Status: DC
  Administered 2020-09-21: 1 via RESPIRATORY_TRACT
  Filled 2020-09-21: qty 4

## 2020-09-21 MED ORDER — ALPRAZOLAM 0.5 MG PO TABS: 0.5000 mg | ORAL_TABLET | Freq: Every evening | ORAL | Status: DC | PRN

## 2020-09-21 MED ORDER — SODIUM CHLORIDE 0.9 % IV SOLN
100.0000 mg | Freq: Every day | INTRAVENOUS | Status: DC
  Administered 2020-09-22 – 2020-09-24 (×3): 100 mg via INTRAVENOUS
  Filled 2020-09-21 (×3): qty 20

## 2020-09-21 MED ORDER — HYDROCOD POLST-CPM POLST ER 10-8 MG/5ML PO SUER: 5.0000 mL | Freq: Two times a day (BID) | ORAL | Status: DC | PRN

## 2020-09-21 NOTE — ED Triage Notes (Signed)
Pt states she has been sick since last Wednesday. Tested positive for Covid today. Having increased sob.  Ems called. Was 78% on R/A when EMS arrived. Pt placed on O2 at 6L and sats increased to 88%. Pt placed on 8L HFNC upon arrival to ED and sats up to 95%.

## 2020-09-21 NOTE — ED Provider Notes (Signed)
[facility] EMERGENCY DEPARTMENT Provider Note   CSN: [id number] Arrival date & time: 09/21/20  2001     History Chief Complaint  Patient presents with  . Shortness of Breath    Kathleen Massey is a 49 y.o. female with a significant hx of obesity presenting to emergency department on day 11 of Covid diagnosis.  She reports symptom onset 11 days ago.  She tested positive as an outpatient several days back.  She came to the ER today because she was feeling significantly worsening shortness of breath.  She has dyspnea on exertion.  On arrival she was requiring 8 L high flow nasal cannula to maintain her sats at 95%.  She says she is breathing better this.  She denies any history of lung problems, including asthma, COPD, or smoking history.  She denies any history of diabetes.  NKDA No medications, no medical problems  HPI     Past Medical History:  Diagnosis Date  . Headache(784.0)   . Ovarian cyst     Patient Active Problem List   Diagnosis Date Noted  . Respiratory failure with hypoxia (HCC) 09/21/2020  . SPONDYLOSIS, CERVICAL 12/10/2010  . H N P-CERVICAL 11/25/2010  . NECK PAIN 09/22/2010    Past Surgical History:  Procedure Laterality Date  . CESAREAN SECTION       OB History    Gravida  4   Para  3   Term  3   Preterm      AB  1   Living  3     SAB  1   TAB      Ectopic      Multiple      Live Births              Family History  Problem Relation Age of Onset  . Other Neg Hx     Social History   Tobacco Use  . Smoking status: Never Smoker  . Smokeless tobacco: Never Used  Vaping Use  . Vaping Use: Never used  Substance Use Topics  . Alcohol use: No  . Drug use: No    Home Medications Prior to Admission medications   Medication Sig Start Date End Date Taking? Authorizing Provider  ALPRAZolam Prudy Feeler) 0.5 MG tablet Take 0.5 mg by mouth at bedtime as needed.    [provider]  ciprofloxacin (CIPRO) 500 MG tablet  Take 500 mg by mouth 2 (two) times daily. 09/18/20   [provider]  cyclobenzaprine (FLEXERIL) 10 MG tablet Take 10 mg by mouth 2 (two) times daily as needed for muscle spasms. 09/18/20   [provider]  estradiol (ESTRACE) 1 MG tablet Take 1 mg by mouth daily. 04/15/20   [provider]  medroxyPROGESTERone (PROVERA) 5 MG tablet Take 5 mg by mouth daily. 04/15/20   [provider]  progesterone (PROMETRIUM) 100 MG capsule Take 100 mg by mouth at bedtime. 06/04/20   [provider]    Allergies    Patient has no known allergies.  Review of Systems   Review of Systems  Constitutional: Positive for appetite change, chills, fatigue and fever.  HENT: Positive for congestion. Negative for sore throat.   Eyes: Negative for pain and visual disturbance.  Respiratory: Positive for cough and shortness of breath.   Cardiovascular: Negative for chest pain and palpitations.  Gastrointestinal: Positive for abdominal pain and nausea. Negative for vomiting.  Genitourinary: Negative for dysuria and hematuria.  Musculoskeletal: Positive for arthralgias and  myalgias.  Skin: Negative for color change and rash.  Neurological: Positive for headaches. Negative for syncope.  Psychiatric/Behavioral: Negative for agitation and confusion.  All other systems reviewed and are negative.   Physical Exam Updated Vital Signs BP 124/63   Pulse 74   Temp 99.6 F (37.6 C) (Oral)   Resp (!) 25   Ht 5\' 4"  (1.626 m)   Wt 121.1 kg   SpO2 91%   BMI 45.83 kg/m   Physical Exam Vitals and nursing note reviewed.  Constitutional:      General: She is not in acute distress.    Appearance: She is well-developed.  HENT:     Head: Normocephalic and atraumatic.  Eyes:     Conjunctiva/sclera: Conjunctivae normal.  Cardiovascular:     Rate and Rhythm: Normal rate and regular rhythm.  Pulmonary:     Effort: Pulmonary effort is normal. No respiratory distress.     Breath  sounds: Rhonchi present.     Comments: 95% on 8L HFNC, no tachypnea, speaking in full sentences Abdominal:     Palpations: Abdomen is soft.     Tenderness: There is no abdominal tenderness.  Musculoskeletal:     Cervical back: Neck supple.  Skin:    General: Skin is warm and dry.  Neurological:     General: No focal deficit present.     Mental Status: She is alert and oriented to person, place, and time.  Psychiatric:        Mood and Affect: Mood normal.        Behavior: Behavior normal.     ED Results / Procedures / Treatments   Labs (all labs ordered are listed, but only abnormal results are displayed) Labs Reviewed  RESPIRATORY PANEL BY RT PCR (FLU A&B, COVID) - Abnormal; Notable for the following components:      Result Value   SARS Coronavirus 2 by RT PCR POSITIVE (*)    All other components within normal limits  COMPREHENSIVE METABOLIC PANEL - Abnormal; Notable for the following components:   Sodium 133 (*)    Potassium 3.0 (*)    Glucose, Bld 148 (*)    Calcium 8.4 (*)    Total Protein 6.3 (*)    Albumin 3.2 (*)    AST 84 (*)    ALT 63 (*)    All other components within normal limits  D-DIMER, QUANTITATIVE (NOT AT [facility]) - Abnormal; Notable for the following components:   D-Dimer, Quant 1.37 (*)    All other components within normal limits  LACTATE DEHYDROGENASE - Abnormal; Notable for the following components:   LDH 367 (*)    All other components within normal limits  FIBRINOGEN - Abnormal; Notable for the following components:   Fibrinogen 677 (*)    All other components within normal limits  C-REACTIVE PROTEIN - Abnormal; Notable for the following components:   CRP 24.5 (*)    All other components within normal limits  CULTURE, BLOOD (ROUTINE X 2)  CULTURE, BLOOD (ROUTINE X 2)  CBC WITH DIFFERENTIAL/PLATELET  LACTIC ACID, PLASMA  PROCALCITONIN  FERRITIN  TRIGLYCERIDES  HIV ANTIBODY (ROUTINE TESTING W REFLEX)  COMPREHENSIVE METABOLIC PANEL  CBC WITH  DIFFERENTIAL/PLATELET  C-REACTIVE PROTEIN  D-DIMER, QUANTITATIVE (NOT AT [facility])  FERRITIN  MAGNESIUM  PHOSPHORUS  POC URINE PREG, ED    EKG None  Radiology DG Chest Portable 1 View  Result Date: 09/21/2020 CLINICAL DATA:  49 year old female with shortness of breath. EXAM: PORTABLE CHEST 1 VIEW COMPARISON:  None. FINDINGS: Bilateral confluent airspace opacities most consistent with multifocal pneumonia, likely viral or atypical in etiology including COVID-19. Clinical correlation is recommended. There is no pleural effusion pneumothorax. The cardiac silhouette is within limits. No acute osseous pathology. IMPRESSION: Multifocal pneumonia. Electronically Signed   By: Elgie Collard M.D.   On: 09/21/2020 21:08    Procedures .Critical Care Performed by: Terald Sleeper, MD Authorized by: Terald Sleeper, MD   Critical care provider statement:    Critical care time (minutes):  35   Critical care was necessary to treat or prevent imminent or life-threatening deterioration of the following conditions:  Respiratory failure   Critical care was time spent personally by me on the following activities:  Discussions with consultants, evaluation of patient's response to treatment, examination of patient, ordering and performing treatments and interventions, ordering and review of laboratory studies, ordering and review of radiographic studies, pulse oximetry, re-evaluation of patient's condition, obtaining history from patient or surrogate and review of old charts Comments:     Covid hypoxia requiring supplemental oxygen   (including critical care time)  Medications Ordered in ED Medications  remdesivir 100 mg in sodium chloride 0.9 % 100 mL IVPB (has no administration in time range)  ALPRAZolam (XANAX) tablet 0.5 mg (has no administration in time range)  famotidine (PEPCID) tablet 20 mg (20 mg Oral Given 09/21/20 2342)  heparin injection 5,000 Units (5,000 Units Subcutaneous Given  09/21/20 2342)  dexamethasone (DECADRON) injection 6 mg (has no administration in time range)  guaiFENesin-dextromethorphan (ROBITUSSIN DM) 100-10 MG/5ML syrup 10 mL (has no administration in time range)  chlorpheniramine-HYDROcodone (TUSSIONEX) 10-8 MG/5ML suspension 5 mL (has no administration in time range)  ascorbic acid (VITAMIN C) tablet 500 mg (has no administration in time range)  zinc sulfate capsule 220 mg (has no administration in time range)  acetaminophen (TYLENOL) tablet 650 mg (has no administration in time range)  polyethylene glycol (MIRALAX / GLYCOLAX) packet 17 g (has no administration in time range)  ondansetron (ZOFRAN) tablet 4 mg (has no administration in time range)    Or  ondansetron (ZOFRAN) injection 4 mg (has no administration in time range)  Ipratropium-Albuterol (COMBIVENT) respimat 1 puff (has no administration in time range)  dexamethasone (DECADRON) injection 10 mg (10 mg Intravenous Given 09/21/20 2230)  remdesivir 100 mg in sodium chloride 0.9 % 100 mL IVPB (0 mg Intravenous Stopped 09/21/20 2311)  potassium chloride (KLOR-CON) packet 40 mEq (40 mEq Oral Given 09/21/20 2342)    ED Course  I have reviewed the triage vital signs and the nursing notes.  Pertinent labs & imaging results that were available during my care of the patient were reviewed by me and considered in my medical decision making (see chart for details).  49 yo female here with covid hypoxic respiratory failure Day 11 of symptoms She is not vaccinated against covid  Here is stable on 8L HFNC.  Will order IV steroids, IV remdesivir per pharmacy, and medically admit  Labs show mild hypoK - she prefers oral repletion. Likely 2/2 poor appetite.  Alb 3.2. Xray chest reviewed personally - shows bilateral infiltrates/covid pattern.   Doubt PE, ACS at this time.  AIrborn precautions ordered   Kathleen Massey was evaluated in Emergency Department on 09/22/2020 for the symptoms described in  the history of present illness. She was evaluated in the context of the global COVID-19 pandemic, which necessitated consideration that the patient might be at risk for infection with the SARS-CoV-2 virus that  causes COVID-19. Institutional protocols and algorithms that pertain to the evaluation of patients at risk for COVID-19 are in a state of rapid change based on information released by regulatory bodies including the CDC and federal and state organizations. These policies and algorithms were followed during the patient's care in the ED.      Final Clinical Impression(s) / ED Diagnoses Final diagnoses:  COVID-19  Acute hypoxemic respiratory failure due to COVID-19 [facility])  Hypokalemia    Rx / DC Orders ED Discharge Orders    None       Tobby Fawcett, Kermit Balo, MD 09/22/20 [phone]

## 2020-09-21 NOTE — ED Notes (Signed)
Date and time results received: 09/21/20 2133  Test: COVID Critical Value: positive  Name of Provider Notified: Trifan, MD  Orders Received? Or Actions Taken?: acknowledged 

## 2020-09-21 NOTE — ED Notes (Signed)
Pt currently on 6L HFNC.

## 2020-09-21 NOTE — H&P (Signed)
TRH H&P    Patient Demographics:    Kathleen Massey, is a 49 y.o. female  MRN: [id number]  DOB - 10/14/1971  Admit Date - 09/21/2020  Referring MD/NP/PA: Renaye Rakersrifan  Outpatient Primary MD for the patient is Assunta FoundGolding, John, MD  Patient coming from: Home  Chief complaint-dyspnea   HPI:    Kathleen Massey  is a 49 y.o. female, with history of obesity and GERD presents to the ER with a chief complaint of dyspnea.  Patient reports that 11 days ago she thought she was having sinus infection type symptoms.  She describes them as sinus congestion, rhinorrhea, headaches, and mild cough.  The symptoms then progressed to body aches violent cough that caused her to be short of breath and some exertional shortness of breath.  Patient does not think she was running a fever.  She has not lost her taste or smell.  She has not had any nausea vomiting or diarrhea.  Patient went into get tested for Covid.  Results came back positive this morning.  She borrowed somebody's pulse ox so that she could monitor her oxygen as recommended by a health professional.  She reports that her oxygen was 80% so she knew she needed to come into the ER.  When EMS picked her up they also report that her oxygen was in the 80s.  They started her on 6 L nasal cannula.  When she got to the ER she was increased to 8 L nasal cannula because she was only satting at 91% with EMS.  Patient was tachypneic on arrival, but that may have been anxiety related at this time she is resting comfortably, breathing to normal rate, saturating at 99% on 6 L nasal cannula.  Patient is not vaccinated for Covid, she does not smoke, and she is full code.  In the ED Temperature 99.6, heart rate 73, respiratory rate 29, blood pressure 117/68, 95% on high flow nasal cannula 8 L last time vitals were done Satting at 99% on 6 L during my exam White blood cell count 4.3, hyponatremia at 133 very  mild, hypokalemia 3.0 BUN/creatinine are normal, glucose 148 Covid positive, flu negative Blood cultures pending Lactic acid and inflammatory markers pending Chest x-ray shows multifocal pneumonia Decadron remdesivir started In attempt to take patient off oxygen resulted in her dropping down into the 80s again    Review of systems:    In addition to the HPI above,  No Fever-chills, No Headache, No changes with Vision or hearing, No problems swallowing food or Liquids, No Chest pain, positive for dry cough and shortness of breath No Abdominal pain, No Nausea or Vomiting, bowel movements are regular, No Blood in stool or Urine, No dysuria, No new skin rashes or bruises, No new joints pains-aches,  No new weakness, tingling, numbness in any extremity, No recent weight gain or loss, No polyuria, polydypsia or polyphagia, No significant Mental Stressors.  All other systems reviewed and are negative.    Past History of the following :    Past Medical History:  Diagnosis Date  . Headache(784.0)   . Ovarian cyst       Past Surgical History:  Procedure Laterality Date  . CESAREAN SECTION        Social History:      Social History   Tobacco Use  . Smoking status: Never Smoker  . Smokeless tobacco: Never Used  Substance Use Topics  . Alcohol use: No       Family History :     Family History  Problem Relation Age of Onset  . Other Neg Hx       Home Medications:   Prior to Admission medications   Medication Sig Start Date End Date Taking? Authorizing Provider  ALPRAZolam Prudy Feeler) 0.5 MG tablet Take 0.5 mg by mouth at bedtime as needed.    [provider]  ibuprofen (ADVIL,MOTRIN) 600 MG tablet Take 1 tablet (600 mg total) by mouth every 6 (six) hours as needed for pain. 03/05/13   Poe, Deirdre C, CNM  ranitidine (ZANTAC) 150 MG tablet Take 150 mg by mouth daily as needed. For acid reflux    [provider]     Allergies:    No Known  Allergies   Physical Exam:   Vitals  Blood pressure 117/68, pulse 73, temperature 99.6 F (37.6 C), temperature source Oral, resp. rate (!) 29, height 5\' 4"  (1.626 m), weight 121.1 kg, SpO2 95 %.  1.  General: Patient resting comfortably in bed at the time of my exam  2. Psychiatric: Mood and behavior normal for situation  3. Neurologic: Cranial nerves II through XII are intact, moves all 4 extremities voluntarily, no focal deficit on limited exam  4. HEENMT:  Head is atraumatic, normocephalic, pupils reactive to light, neck is supple, trachea is midline, mucous membranes are moist  5. Respiratory : Lungs are clear to auscultation bilaterally  6. Cardiovascular : Heart rate is normal, rhythm is regular, no murmurs rubs or gallops  7. Gastrointestinal:  Abdomen is obese, soft, nontender, nondistended  8. Skin:  No acute lesions on limited skin exam  9.Musculoskeletal:  No peripheral edema or acute deformity    Data Review:    CBC Recent Labs  Lab 09/21/20 2015  WBC 4.3  HGB 13.1  HCT 38.1  PLT 215  MCV 89.4  MCH 30.8  MCHC 34.4  RDW 12.1  LYMPHSABS 0.7  MONOABS 0.1  EOSABS 0.0  BASOSABS 0.0   ------------------------------------------------------------------------------------------------------------------  Results for orders placed or performed during the hospital encounter of 09/21/20 (from the past 48 hour(s))  Respiratory Panel by RT PCR (Flu A&B, Covid) - Nasopharyngeal Swab     Status: Abnormal   Collection Time: 09/21/20  8:12 PM   Specimen: Nasopharyngeal Swab  Result Value Ref Range   SARS Coronavirus 2 by RT PCR POSITIVE (A) NEGATIVE    Comment: RESULT CALLED TO, READ BACK BY AND VERIFIED WITH: 09/23/20 AT 2132 ON 09/21/2020 BY MOSLEY,J (NOTE) SARS-CoV-2 target nucleic acids are DETECTED.  SARS-CoV-2 RNA is generally detectable in upper respiratory specimens  during the acute phase of infection. Positive results are indicative of the  presence of the identified virus, but do not rule out bacterial infection or co-infection with other pathogens not detected by the test. Clinical correlation with patient history and other diagnostic information is necessary to determine patient infection status. The expected result is Negative.  Fact Sheet for Patients:  09/23/2020  Fact Sheet for Healthcare Providers: https://www.moore.com/  This test is not yet approved or cleared by  the [employer] and  has been authorized for detection and/or diagnosis of SARS-CoV-2 by FDA under an Emergency Use Authorization (EUA).  This EUA will remain in effect (meaning this test  can be used) for the duration of  the COVID-19 declaration under Section 564(b)(1) of the Act, 21 U.S.C. section 360bbb-3(b)(1), unless the authorization is terminated or revoked sooner.      Influenza A by PCR NEGATIVE NEGATIVE   Influenza B by PCR NEGATIVE NEGATIVE    Comment: (NOTE) The Xpert Xpress SARS-CoV-2/FLU/RSV assay is intended as an aid in  the diagnosis of influenza from Nasopharyngeal swab specimens and  should not be used as a sole basis for treatment. Nasal washings and  aspirates are unacceptable for Xpert Xpress SARS-CoV-2/FLU/RSV  testing.  Fact Sheet for Patients: https://www.moore.com/  Fact Sheet for Healthcare Providers: https://www.young.biz/  This test is not yet approved or cleared by the [place] FDA and  has been authorized for detection and/or diagnosis of SARS-CoV-2 by  FDA under an Emergency Use Authorization (EUA). This EUA will remain  in effect (meaning this test can be used) for the duration of the  Covid-19 declaration under Section 564(b)(1) of the Act, 21  U.S.C. section 360bbb-3(b)(1), unless the authorization is  terminated or revoked. Performed at [facility], [address] [place], [place] [address]   CBC with  Differential     Status: None   Collection Time: 09/21/20  8:15 PM  Result Value Ref Range   WBC 4.3 4.0 - 10.5 K/uL   RBC 4.26 3.87 - 5.11 MIL/uL   Hemoglobin 13.1 12.0 - 15.0 g/dL   HCT 89.3 36 - 46 %   MCV 89.4 80.0 - 100.0 fL   MCH 30.8 26.0 - 34.0 pg   MCHC 34.4 30.0 - 36.0 g/dL   RDW 81.0 17.5 - 10.2 %   Platelets 215 150 - 400 K/uL   nRBC 0.0 0.0 - 0.2 %   Neutrophils Relative % 79 %   Neutro Abs 3.4 1.7 - 7.7 K/uL   Lymphocytes Relative 17 %   Lymphs Abs 0.7 0.7 - 4.0 K/uL   Monocytes Relative 3 %   Monocytes Absolute 0.1 0.1 - 1.0 K/uL   Eosinophils Relative 0 %   Eosinophils Absolute 0.0 0.0 - 0.5 K/uL   Basophils Relative 0 %   Basophils Absolute 0.0 0.0 - 0.1 K/uL   Immature Granulocytes 1 %   Abs Immature Granulocytes 0.02 0.00 - 0.07 K/uL    Comment: Performed at [facility], [address] [place], [place] [address]  Comprehensive metabolic panel     Status: Abnormal   Collection Time: 09/21/20  8:15 PM  Result Value Ref Range   Sodium 133 (L) 135 - 145 mmol/L   Potassium 3.0 (L) 3.5 - 5.1 mmol/L   Chloride 98 98 - 111 mmol/L   CO2 26 22 - 32 mmol/L   Glucose, Bld 148 (H) 70 - 99 mg/dL    Comment: Glucose reference range applies only to samples taken after fasting for at least 8 hours.   BUN 10 6 - 20 mg/dL   Creatinine, Ser 7.82 0.44 - 1.00 mg/dL   Calcium 8.4 (L) 8.9 - 10.3 mg/dL   Total Protein 6.3 (L) 6.5 - 8.1 g/dL   Albumin 3.2 (L) 3.5 - 5.0 g/dL   AST 84 (H) 15 - 41 U/L   ALT 63 (H) 0 - 44 U/L   Alkaline Phosphatase 90 38 - 126 U/L  Total Bilirubin 0.7 0.3 - 1.2 mg/dL   GFR, Estimated >16 >10 mL/min   Anion gap 9 5 - 15    Comment: Performed at [facility], [address] [place], [place] [address]  Lactate dehydrogenase     Status: Abnormal   Collection Time: 09/21/20  8:15 PM  Result Value Ref Range   LDH 367 (H) 98 - 192 U/L    Comment: Performed at [facility], [address] [place], [place] [address]    Chemistries  Recent Labs    Lab 09/21/20 2015  NA 133*  K 3.0*  CL 98  CO2 26  GLUCOSE 148*  BUN 10  CREATININE 0.64  CALCIUM 8.4*  AST 84*  ALT 63*  ALKPHOS 90  BILITOT 0.7   ------------------------------------------------------------------------------------------------------------------  ------------------------------------------------------------------------------------------------------------------ GFR: Estimated Creatinine Clearance: 106.8 mL/min (by C-G formula based on SCr of 0.64 mg/dL). Liver Function Tests: Recent Labs  Lab 09/21/20 2015  AST 84*  ALT 63*  ALKPHOS 90  BILITOT 0.7  PROT 6.3*  ALBUMIN 3.2*   No results for input(s): LIPASE, AMYLASE in the last 168 hours. No results for input(s): AMMONIA in the last 168 hours. Coagulation Profile: No results for input(s): INR, PROTIME in the last 168 hours. Cardiac Enzymes: No results for input(s): CKTOTAL, CKMB, CKMBINDEX, TROPONINI in the last 168 hours. BNP (last 3 results) No results for input(s): PROBNP in the last 8760 hours. HbA1C: No results for input(s): HGBA1C in the last 72 hours. CBG: No results for input(s): GLUCAP in the last 168 hours. Lipid Profile: No results for input(s): CHOL, HDL, LDLCALC, TRIG, CHOLHDL, LDLDIRECT in the last 72 hours. Thyroid Function Tests: No results for input(s): TSH, T4TOTAL, FREET4, T3FREE, THYROIDAB in the last 72 hours. Anemia Panel: No results for input(s): VITAMINB12, FOLATE, FERRITIN, TIBC, IRON, RETICCTPCT in the last 72 hours.  --------------------------------------------------------------------------------------------------------------- Urine analysis:    Component Value Date/Time   COLORURINE YELLOW 03/05/2013 0010   APPEARANCEUR CLEAR 03/05/2013 0010   LABSPEC >1.030 (H) 03/05/2013 0010   PHURINE 5.0 03/05/2013 0010   GLUCOSEU NEGATIVE 03/05/2013 0010   HGBUR SMALL (A) 03/05/2013 0010   BILIRUBINUR NEGATIVE 03/05/2013 0010   KETONESUR NEGATIVE 03/05/2013 0010   PROTEINUR  NEGATIVE 03/05/2013 0010   UROBILINOGEN 0.2 03/05/2013 0010   NITRITE NEGATIVE 03/05/2013 0010   LEUKOCYTESUR NEGATIVE 03/05/2013 0010      Imaging Results:    DG Chest Portable 1 View  Result Date: 09/21/2020 CLINICAL DATA:  49 year old female with shortness of breath. EXAM: PORTABLE CHEST 1 VIEW COMPARISON:  None. FINDINGS: Bilateral confluent airspace opacities most consistent with multifocal pneumonia, likely viral or atypical in etiology including COVID-19. Clinical correlation is recommended. There is no pleural effusion pneumothorax. The cardiac silhouette is within limits. No acute osseous pathology. IMPRESSION: Multifocal pneumonia. Electronically Signed   By: Elgie Collard M.D.   On: 09/21/2020 21:08       Assessment & Plan:    Active Problems:   Respiratory failure with hypoxia (HCC)   1. Acute hypoxic respiratory failure 1. O2 sats as low as 80% without oxygen 2. Currently stabilized oxygen saturations with 8 L high flow nasal cannula 3. Secondary to Covid infection 4. Chest x-ray shows multi focal pneumonia 5. Monitor on stepdown 6. Wean off O2 as tolerated 2. Hypokalemia 1. Replace and recheck 3. Covid infection 1. 11 days of symptoms 2. Inflammatory markers pending 3. Decadron and remdesivir started 4. Continue Decadron, antitussives, remdesivir, inhaler 5. Continue to monitor 4. Protein calorie  malnutrition 1. Secondary to poor p.o. intake over the last 11 days 2. Encourage nutrient dense food choices 3. Continue to monitor    DVT Prophylaxis-   Heparin - SCDs   AM Labs Ordered, also please review Full Orders  Family Communication: No family at bedside Code Status:  FULL  Admission status:Inpatient :The appropriate admission status for this patient is INPATIENT. Inpatient status is judged to be reasonable and necessary in order to provide the required intensity of service to ensure the patient's safety. The patient's presenting symptoms, physical  exam findings, and initial radiographic and laboratory data in the context of their chronic comorbidities is felt to place them at high risk for further clinical deterioration. Furthermore, it is not anticipated that the patient will be medically stable for discharge from the hospital within 2 midnights of admission. The following factors support the admission status of inpatient.     The patient's presenting symptoms include dyspnea The worrisome physical exam findings include hypoxic to 80% The initial radiographic and laboratory data are worrisome because of Multifocal pneumonia and covid positive The chronic co-morbidities include obesity, GERD       * I certify that at the point of admission it is my clinical judgment that the patient will require inpatient hospital care spanning beyond 2 midnights from the point of admission due to high intensity of service, high risk for further deterioration and high frequency of surveillance required.*  Time spent in minutes : 67   Soul Hackman B Zierle-Ghosh DO

## 2020-09-22 DIAGNOSIS — J1282 Pneumonia due to coronavirus disease 2019: Secondary | ICD-10-CM

## 2020-09-22 DIAGNOSIS — U071 COVID-19: Principal | ICD-10-CM

## 2020-09-22 DIAGNOSIS — J9601 Acute respiratory failure with hypoxia: Secondary | ICD-10-CM

## 2020-09-22 LAB — CBC WITH DIFFERENTIAL/PLATELET
Band Neutrophils: 1 %
Basophils Absolute: 0 10*3/uL (ref 0.0–0.1)
Basophils Relative: 0 %
Eosinophils Absolute: 0 10*3/uL (ref 0.0–0.5)
Eosinophils Relative: 0 %
HCT: 40.2 % (ref 36.0–46.0)
Hemoglobin: 13.2 g/dL (ref 12.0–15.0)
Lymphocytes Relative: 15 %
Lymphs Abs: 0.7 10*3/uL (ref 0.7–4.0)
MCH: 30.2 pg (ref 26.0–34.0)
MCHC: 32.8 g/dL (ref 30.0–36.0)
MCV: 92 fL (ref 80.0–100.0)
Monocytes Absolute: 0 10*3/uL — ABNORMAL LOW (ref 0.1–1.0)
Monocytes Relative: 1 %
Neutro Abs: 3.9 10*3/uL (ref 1.7–7.7)
Neutrophils Relative %: 83 %
Platelets: 236 10*3/uL (ref 150–400)
RBC: 4.37 MIL/uL (ref 3.87–5.11)
RDW: 12 % (ref 11.5–15.5)
WBC Morphology: ABNORMAL
WBC: 4.7 10*3/uL (ref 4.0–10.5)
nRBC: 0 % (ref 0.0–0.2)

## 2020-09-22 LAB — COMPREHENSIVE METABOLIC PANEL
ALT: 62 U/L — ABNORMAL HIGH (ref 0–44)
AST: 81 U/L — ABNORMAL HIGH (ref 15–41)
Albumin: 3.1 g/dL — ABNORMAL LOW (ref 3.5–5.0)
Alkaline Phosphatase: 95 U/L (ref 38–126)
Anion gap: 18 — ABNORMAL HIGH (ref 5–15)
BUN: 9 mg/dL (ref 6–20)
CO2: 26 mmol/L (ref 22–32)
Calcium: 9.3 mg/dL (ref 8.9–10.3)
Chloride: 97 mmol/L — ABNORMAL LOW (ref 98–111)
Creatinine, Ser: 0.62 mg/dL (ref 0.44–1.00)
GFR, Estimated: >60 mL/min (ref >60)
Glucose, Bld: 161 mg/dL — ABNORMAL HIGH (ref 70–99)
Potassium: 3.6 mmol/L (ref 3.5–5.1)
Sodium: 141 mmol/L (ref 135–145)
Total Bilirubin: 0.4 mg/dL (ref 0.3–1.2)
Total Protein: 6.4 g/dL — ABNORMAL LOW (ref 6.5–8.1)

## 2020-09-22 LAB — C-REACTIVE PROTEIN: CRP: 28.5 mg/dL — ABNORMAL HIGH (ref <1.0)

## 2020-09-22 LAB — HIV ANTIBODY (ROUTINE TESTING W REFLEX): HIV Screen 4th Generation wRfx: NONREACTIVE

## 2020-09-22 LAB — PHOSPHORUS: Phosphorus: 2.7 mg/dL (ref 2.5–4.6)

## 2020-09-22 LAB — MAGNESIUM: Magnesium: 1.9 mg/dL (ref 1.7–2.4)

## 2020-09-22 LAB — D-DIMER, QUANTITATIVE: D-Dimer, Quant: 1.35 ug/mL-FEU — ABNORMAL HIGH (ref 0.00–0.50)

## 2020-09-22 LAB — FERRITIN: Ferritin: 267 ng/mL (ref 11–307)

## 2020-09-22 MED ORDER — BARICITINIB 2 MG PO TABS
4.0000 mg | ORAL_TABLET | Freq: Every day | ORAL | Status: DC
  Administered 2020-09-22 – 2020-09-24 (×3): 4 mg via ORAL
  Filled 2020-09-22 (×3): qty 2

## 2020-09-22 MED ORDER — ENOXAPARIN SODIUM 60 MG/0.6ML ~~LOC~~ SOLN
60.0000 mg | Freq: Two times a day (BID) | SUBCUTANEOUS | Status: DC
  Administered 2020-09-22 – 2020-09-24 (×5): 60 mg via SUBCUTANEOUS
  Filled 2020-09-22 (×5): qty 0.6

## 2020-09-22 NOTE — Progress Notes (Addendum)
PROGRESS NOTE   Kathleen Massey  OQH:[id number] DOB: 02/24/1971 DOA: 09/21/2020 PCP: Assunta Found, MD   Chief Complaint  Patient presents with   Shortness of Breath    Brief Admission History:  49 y.o. female, with history of obesity and GERD presents to the ER with a chief complaint of dyspnea.  Patient reports that 11 days ago she thought she was having sinus infection type symptoms.  She describes them as sinus congestion, rhinorrhea, headaches, and mild cough.  The symptoms then progressed to body aches violent cough that caused her to be short of breath and some exertional shortness of breath.  Patient does not think she was running a fever.  She has not lost her taste or smell.  She has not had any nausea vomiting or diarrhea.  Patient went into get tested for Covid.  Results came back positive this morning.  She borrowed somebody's pulse ox so that she could monitor her oxygen as recommended by a health professional.  She reports that her oxygen was 80% so she knew she needed to come into the ER.  When EMS picked her up they also report that her oxygen was in the 80s.  They started her on 6 L nasal cannula.  When she got to the ER she was increased to 8 L nasal cannula because she was only satting at 91% with EMS.  Patient was tachypneic on arrival, but that may have been anxiety related at this time she is resting comfortably, breathing to normal rate, saturating at 99% on 6 L nasal cannula.  Patient is not vaccinated for Covid, she does not smoke, and she is full code.  Assessment & Plan:   Active Problems:   Respiratory failure with hypoxia (HCC)   Pneumonia due to COVID-19 virus  Acute respiratory failure with hypoxia secondary to Covid pneumonia - Pt presented with low oxygen saturation in 80s and multifocal pneumonia on chest xray. Continue to monitor inflammatory markers.  Continue remdesivir, decadron, added baricitinib.  Hypokalemia - repleted, following.  Moderate protein  calorie malnutrition - from poor oral intake over past several days prior to arrival.   Elevated d dimer - enoxaparin 0.5 mg/kg every 12 hours until D dimer <1.   Morbid obesity BMI >45.   DVT prophylaxis: enoxaparin Code Status: full  Family Communication: call to spouse no answer  Disposition:   Status is: Inpatient  Remains inpatient appropriate because:IV treatments appropriate due to intensity of illness or inability to take PO and Inpatient level of care appropriate due to severity of illness  Dispo: The patient is from: Home              Anticipated d/c is to: Home              Anticipated d/c date is: > 3 days              Patient currently is not medically stable to d/c.  Consultants:    Procedures:  n/a  Antimicrobials:    Subjective: Pt reports cough and chest congestion, malaise and poor appetite.   Objective: Vitals:   09/22/20 1100 09/22/20 1200 09/22/20 1300 09/22/20 1400  BP: 121/78 137/71 111/69 (!) 118/57  Pulse: 72 77 71 70  Resp: (!) 28 (!) 30 (!) 25 (!) 21  Temp:      TempSrc:      SpO2: 94% 92% 92% 91%  Weight:      Height:  Intake/Output Summary (Last 24 hours) at 09/22/2020 1716 Last data filed at 09/21/2020 2311 Gross per 24 hour  Intake 100 ml  Output --  Net 100 ml   Filed Weights   09/21/20 2008  Weight: 121.1 kg    Examination:  General exam: Appears calm and comfortable  Respiratory system: poor air movement with rales heard, moderate increased work of breathing.  Cardiovascular system: S1 & S2 heard, RRR. No JVD, murmurs, rubs, gallops or clicks. No pedal edema. Gastrointestinal system: Abdomen is nondistended, soft and nontender. No organomegaly or masses felt. Normal bowel sounds heard. Central nervous system: Alert and oriented. No focal neurological deficits. Extremities: Symmetric 5 x 5 power. Skin: No rashes, lesions or ulcers Psychiatry: Judgement and insight appear normal. Mood & affect appropriate.   Data  Reviewed: I have personally reviewed following labs and imaging studies  CBC: Recent Labs  Lab 09/21/20 2015 09/22/20 0451  WBC 4.3 4.7  NEUTROABS 3.4 3.9  HGB 13.1 13.2  HCT 38.1 40.2  MCV 89.4 92.0  PLT 215 236    Basic Metabolic Panel: Recent Labs  Lab 09/21/20 2015 09/22/20 0451  NA 133* 141  K 3.0* 3.6  CL 98 97*  CO2 26 26  GLUCOSE 148* 161*  BUN 10 9  CREATININE 0.64 0.62  CALCIUM 8.4* 9.3  MG  --  1.9  PHOS  --  2.7    GFR: Estimated Creatinine Clearance: 106.8 mL/min (by C-G formula based on SCr of 0.62 mg/dL).  Liver Function Tests: Recent Labs  Lab 09/21/20 2015 09/22/20 0451  AST 84* 81*  ALT 63* 62*  ALKPHOS 90 95  BILITOT 0.7 0.4  PROT 6.3* 6.4*  ALBUMIN 3.2* 3.1*    CBG: No results for input(s): GLUCAP in the last 168 hours.  Recent Results (from the past 240 hour(s))  Respiratory Panel by RT PCR (Flu A&B, Covid) - Nasopharyngeal Swab     Status: Abnormal   Collection Time: 09/21/20  8:12 PM   Specimen: Nasopharyngeal Swab  Result Value Ref Range Status   SARS Coronavirus 2 by RT PCR POSITIVE (A) NEGATIVE Final    Comment: RESULT CALLED TO, READ BACK BY AND VERIFIED WITH: Lockie Pares AT 2132 ON 09/21/2020 BY MOSLEY,J (NOTE) SARS-CoV-2 target nucleic acids are DETECTED.  SARS-CoV-2 RNA is generally detectable in upper respiratory specimens  during the acute phase of infection. Positive results are indicative of the presence of the identified virus, but do not rule out bacterial infection or co-infection with other pathogens not detected by the test. Clinical correlation with patient history and other diagnostic information is necessary to determine patient infection status. The expected result is Negative.  Fact Sheet for Patients:  https://www.moore.com/  Fact Sheet for Healthcare Providers: https://www.young.biz/  This test is not yet approved or cleared by the [place] FDA and  has  been authorized for detection and/or diagnosis of SARS-CoV-2 by FDA under an Emergency Use Authorization (EUA).  This EUA will remain in effect (meaning this test  can be used) for the duration of  the COVID-19 declaration under Section 564(b)(1) of the Act, 21 U.S.C. section 360bbb-3(b)(1), unless the authorization is terminated or revoked sooner.      Influenza A by PCR NEGATIVE NEGATIVE Final   Influenza B by PCR NEGATIVE NEGATIVE Final    Comment: (NOTE) The Xpert Xpress SARS-CoV-2/FLU/RSV assay is intended as an aid in  the diagnosis of influenza from Nasopharyngeal swab specimens and  should not be used as a sole  basis for treatment. Nasal washings and  aspirates are unacceptable for Xpert Xpress SARS-CoV-2/FLU/RSV  testing.  Fact Sheet for Patients: https://www.moore.com/  Fact Sheet for Healthcare Providers: https://www.young.biz/  This test is not yet approved or cleared by the [place] FDA and  has been authorized for detection and/or diagnosis of SARS-CoV-2 by  FDA under an Emergency Use Authorization (EUA). This EUA will remain  in effect (meaning this test can be used) for the duration of the  Covid-19 declaration under Section 564(b)(1) of the Act, 21  U.S.C. section 360bbb-3(b)(1), unless the authorization is  terminated or revoked. Performed at [facility], [address], [place], [place] [address]      Radiology Studies: DG Chest Portable 1 View  Result Date: 09/21/2020 CLINICAL DATA:  49 year old female with shortness of breath. EXAM: PORTABLE CHEST 1 VIEW COMPARISON:  None. FINDINGS: Bilateral confluent airspace opacities most consistent with multifocal pneumonia, likely viral or atypical in etiology including COVID-19. Clinical correlation is recommended. There is no pleural effusion pneumothorax. The cardiac silhouette is within limits. No acute osseous pathology. IMPRESSION: Multifocal pneumonia. Electronically  Signed   By: Elgie Collard M.D.   On: 09/21/2020 21:08   Scheduled Meds:  vitamin C  500 mg Oral Daily   dexamethasone (DECADRON) injection  6 mg Intravenous Q24H   enoxaparin (LOVENOX) injection  60 mg Subcutaneous Q12H   famotidine  20 mg Oral QHS   Ipratropium-Albuterol  1 puff Inhalation TID   zinc sulfate  220 mg Oral Daily   Continuous Infusions:  remdesivir 100 mg in NS 100 mL       LOS: 1 day   Time spent: 32 mins   Beryl Balz Laural Benes, MD How to contact the [facility] Attending or Consulting provider 7A - 7P or covering provider during after hours 7P -7A, for this patient?  Check the care team in [facility] and look for a) attending/consulting [facility] provider listed and b) the [facility] team listed Log into [email or web address] and use [place]'s universal password to access. If you do not have the password, please contact the hospital operator. Locate the [facility] provider you are looking for under [facility] and page to a number that you can be directly reached. If you still have difficulty reaching the provider, please page the [facility] (Director on Call) for the Hospitalists listed on amion for assistance.  09/22/2020, 5:16 PM

## 2020-09-22 NOTE — ED Notes (Signed)
Pt agreed to be placed on purewick due to O2 dropping when pt stands to use the restroom.

## 2020-09-23 LAB — COMPREHENSIVE METABOLIC PANEL
ALT: 51 U/L — ABNORMAL HIGH (ref 0–44)
AST: 51 U/L — ABNORMAL HIGH (ref 15–41)
Albumin: 2.9 g/dL — ABNORMAL LOW (ref 3.5–5.0)
Alkaline Phosphatase: 76 U/L (ref 38–126)
Anion gap: 12 (ref 5–15)
BUN: 12 mg/dL (ref 6–20)
CO2: 28 mmol/L (ref 22–32)
Calcium: 8.7 mg/dL — ABNORMAL LOW (ref 8.9–10.3)
Chloride: 99 mmol/L (ref 98–111)
Creatinine, Ser: 0.64 mg/dL (ref 0.44–1.00)
GFR, Estimated: >60 mL/min (ref >60)
Glucose, Bld: 143 mg/dL — ABNORMAL HIGH (ref 70–99)
Potassium: 3.4 mmol/L — ABNORMAL LOW (ref 3.5–5.1)
Sodium: 139 mmol/L (ref 135–145)
Total Bilirubin: 0.4 mg/dL (ref 0.3–1.2)
Total Protein: 6 g/dL — ABNORMAL LOW (ref 6.5–8.1)

## 2020-09-23 LAB — CBC WITH DIFFERENTIAL/PLATELET
Abs Immature Granulocytes: 0.07 10*3/uL (ref 0.00–0.07)
Basophils Absolute: 0 10*3/uL (ref 0.0–0.1)
Basophils Relative: 0 %
Eosinophils Absolute: 0 10*3/uL (ref 0.0–0.5)
Eosinophils Relative: 0 %
HCT: 39.5 % (ref 36.0–46.0)
Hemoglobin: 12.9 g/dL (ref 12.0–15.0)
Immature Granulocytes: 1 %
Lymphocytes Relative: 14 %
Lymphs Abs: 0.7 10*3/uL (ref 0.7–4.0)
MCH: 30.1 pg (ref 26.0–34.0)
MCHC: 32.7 g/dL (ref 30.0–36.0)
MCV: 92.1 fL (ref 80.0–100.0)
Monocytes Absolute: 0.4 10*3/uL (ref 0.1–1.0)
Monocytes Relative: 7 %
Neutro Abs: 4.1 10*3/uL (ref 1.7–7.7)
Neutrophils Relative %: 78 %
Platelets: 299 10*3/uL (ref 150–400)
RBC: 4.29 MIL/uL (ref 3.87–5.11)
RDW: 11.9 % (ref 11.5–15.5)
WBC: 5.3 10*3/uL (ref 4.0–10.5)
nRBC: 0 % (ref 0.0–0.2)

## 2020-09-23 LAB — PHOSPHORUS: Phosphorus: 2.7 mg/dL (ref 2.5–4.6)

## 2020-09-23 LAB — C-REACTIVE PROTEIN: CRP: 15.1 mg/dL — ABNORMAL HIGH (ref <1.0)

## 2020-09-23 LAB — FERRITIN: Ferritin: 321 ng/mL — ABNORMAL HIGH (ref 11–307)

## 2020-09-23 LAB — MAGNESIUM: Magnesium: 2.3 mg/dL (ref 1.7–2.4)

## 2020-09-23 LAB — D-DIMER, QUANTITATIVE: D-Dimer, Quant: 0.91 ug/mL-FEU — ABNORMAL HIGH (ref 0.00–0.50)

## 2020-09-23 MED ORDER — POTASSIUM CHLORIDE CRYS ER 20 MEQ PO TBCR
40.0000 meq | EXTENDED_RELEASE_TABLET | Freq: Once | ORAL | Status: AC
  Administered 2020-09-23: 40 meq via ORAL
  Filled 2020-09-23: qty 2

## 2020-09-23 NOTE — Progress Notes (Signed)
PROGRESS NOTE   Kathleen Massey  VOJ:[id number] DOB: 03-12-1971 DOA: 09/21/2020 PCP: Assunta Found, MD   Chief Complaint  Patient presents with  . Shortness of Breath    Brief Admission History:  49 y.o. female, with history of obesity and GERD presents to the ER with a chief complaint of dyspnea.  Patient reports that 11 days ago she thought she was having sinus infection type symptoms.  She describes them as sinus congestion, rhinorrhea, headaches, and mild cough.  The symptoms then progressed to body aches violent cough that caused her to be short of breath and some exertional shortness of breath.  Patient does not think she was running a fever.  She has not lost her taste or smell.  She has not had any nausea vomiting or diarrhea.  Patient went into get tested for Covid.  Results came back positive this morning.  She borrowed somebody's pulse ox so that she could monitor her oxygen as recommended by a health professional.  She reports that her oxygen was 80% so she knew she needed to come into the ER.  When EMS picked her up they also report that her oxygen was in the 80s.  They started her on 6 L nasal cannula.  When she got to the ER she was increased to 8 L nasal cannula because she was only satting at 91% with EMS.  Patient was tachypneic on arrival, but that may have been anxiety related at this time she is resting comfortably, breathing to normal rate, saturating at 99% on 6 L nasal cannula.  Patient is not vaccinated for Covid, she does not smoke, and she is full code.  Assessment & Plan:   Active Problems:   Respiratory failure with hypoxia (HCC)   Pneumonia due to COVID-19 virus  1. Acute respiratory failure with hypoxia secondary to Covid pneumonia - Pt presented with low oxygen saturation in 80s and multifocal pneumonia on chest xray. Continue to monitor inflammatory markers.  They remain high but slowly trending down.  Continue remdesivir, decadron, baricitinib.  2. Hypokalemia -  repleted, following.  3. Moderate protein calorie malnutrition - from poor oral intake over past several days prior to arrival.   4. Elevated d dimer - enoxaparin 0.5 mg/kg every 12 hours until D dimer <1.   5. Morbid obesity BMI >45.  6. Hypokalemia - oral potassium ordered, recheck in AM.   DVT prophylaxis: enoxaparin Code Status: full  Family Communication: call to spouse no answer  Disposition:   Status is: Inpatient  Remains inpatient appropriate because:IV treatments appropriate due to intensity of illness or inability to take PO and Inpatient level of care appropriate due to severity of illness  Dispo: The patient is from: Home              Anticipated d/c is to: Home              Anticipated d/c date is: > 3 days              Patient currently is not medically stable to d/c.  Consultants:     Procedures:   n/a  Antimicrobials:    Subjective: Pt still feels unwell. She is coughing and very SOB.   Objective: Vitals:   09/23/20 0757 09/23/20 0800 09/23/20 1345 09/23/20 1347  BP:   (!) 100/47 (!) 94/48  Pulse:   (!) 57 (!) 57  Resp:   18 18  Temp:      TempSrc:  SpO2: 96% (!) 88% 96% 98%  Weight:      Height:        Intake/Output Summary (Last 24 hours) at 09/23/2020 1410 Last data filed at 09/23/2020 1020 Gross per 24 hour  Intake 580 ml  Output --  Net 580 ml   Filed Weights   09/21/20 2008  Weight: 121.1 kg    Examination:  General exam: Appears calm and comfortable  Respiratory system: poor air movement with rales heard, moderate increased work of breathing.  Cardiovascular system: S1 & S2 heard, RRR. No JVD, murmurs, rubs, gallops or clicks. No pedal edema. Gastrointestinal system: Abdomen is nondistended, soft and nontender. No organomegaly or masses felt. Normal bowel sounds heard. Central nervous system: Alert and oriented. No focal neurological deficits. Extremities: Symmetric 5 x 5 power. Skin: No rashes, lesions or  ulcers Psychiatry: Judgement and insight appear normal. Mood & affect appropriate.   Data Reviewed: I have personally reviewed following labs and imaging studies  CBC: Recent Labs  Lab 09/21/20 2015 09/22/20 0451 09/23/20 0618  WBC 4.3 4.7 5.3  NEUTROABS 3.4 3.9 4.1  HGB 13.1 13.2 12.9  HCT 38.1 40.2 39.5  MCV 89.4 92.0 92.1  PLT 215 236 299    Basic Metabolic Panel: Recent Labs  Lab 09/21/20 2015 09/22/20 0451 09/23/20 0618  NA 133* 141 139  K 3.0* 3.6 3.4*  CL 98 97* 99  CO2 26 26 28   GLUCOSE 148* 161* 143*  BUN 10 9 12   CREATININE 0.64 0.62 0.64  CALCIUM 8.4* 9.3 8.7*  MG  --  1.9 2.3  PHOS  --  2.7 2.7    GFR: Estimated Creatinine Clearance: 106.8 mL/min (by C-G formula based on SCr of 0.64 mg/dL).  Liver Function Tests: Recent Labs  Lab 09/21/20 2015 09/22/20 0451 09/23/20 0618  AST 84* 81* 51*  ALT 63* 62* 51*  ALKPHOS 90 95 76  BILITOT 0.7 0.4 0.4  PROT 6.3* 6.4* 6.0*  ALBUMIN 3.2* 3.1* 2.9*    CBG: No results for input(s): GLUCAP in the last 168 hours.  Recent Results (from the past 240 hour(s))  Respiratory Panel by RT PCR (Flu A&B, Covid) - Nasopharyngeal Swab     Status: Abnormal   Collection Time: 09/21/20  8:12 PM   Specimen: Nasopharyngeal Swab  Result Value Ref Range Status   SARS Coronavirus 2 by RT PCR POSITIVE (A) NEGATIVE Final    Comment: RESULT CALLED TO, READ BACK BY AND VERIFIED WITH: Lockie ParesWALKER,T AT 2132 ON 09/21/2020 BY MOSLEY,J (NOTE) SARS-CoV-2 target nucleic acids are DETECTED.  SARS-CoV-2 RNA is generally detectable in upper respiratory specimens  during the acute phase of infection. Positive results are indicative of the presence of the identified virus, but do not rule out bacterial infection or co-infection with other pathogens not detected by the test. Clinical correlation with patient history and other diagnostic information is necessary to determine patient infection status. The expected result is  Negative.  Fact Sheet for Patients:  https://www.moore.com/https://www.fda.gov/media/142436/download  Fact Sheet for Healthcare Providers: https://www.young.biz/https://www.fda.gov/media/142435/download  This test is not yet approved or cleared by the [place] FDA and  has been authorized for detection and/or diagnosis of SARS-CoV-2 by FDA under an Emergency Use Authorization (EUA).  This EUA will remain in effect (meaning this test  can be used) for the duration of  the COVID-19 declaration under Section 564(b)(1) of the Act, 21 U.S.C. section 360bbb-3(b)(1), unless the authorization is terminated or revoked sooner.      Influenza  A by PCR NEGATIVE NEGATIVE Final   Influenza B by PCR NEGATIVE NEGATIVE Final    Comment: (NOTE) The Xpert Xpress SARS-CoV-2/FLU/RSV assay is intended as an aid in  the diagnosis of influenza from Nasopharyngeal swab specimens and  should not be used as a sole basis for treatment. Nasal washings and  aspirates are unacceptable for Xpert Xpress SARS-CoV-2/FLU/RSV  testing.  Fact Sheet for Patients: https://www.moore.com/  Fact Sheet for Healthcare Providers: https://www.young.biz/  This test is not yet approved or cleared by the [place] FDA and  has been authorized for detection and/or diagnosis of SARS-CoV-2 by  FDA under an Emergency Use Authorization (EUA). This EUA will remain  in effect (meaning this test can be used) for the duration of the  Covid-19 declaration under Section 564(b)(1) of the Act, 21  U.S.C. section 360bbb-3(b)(1), unless the authorization is  terminated or revoked. Performed at [facility], [address] [place], [place] [address]   Blood Culture (routine x 2)     Status: None (Preliminary result)   Collection Time: 09/21/20  8:15 PM   Specimen: BLOOD  Result Value Ref Range Status   Specimen Description BLOOD  Final   Special Requests NONE  Final   Culture   Final    NO GROWTH 2 DAYS Performed at [facility], [address] [place], [place] [address]    Report Status PENDING  Incomplete  Blood Culture (routine x 2)     Status: None (Preliminary result)   Collection Time: 09/21/20  8:30 PM   Specimen: BLOOD  Result Value Ref Range Status   Specimen Description BLOOD  Final   Special Requests NONE  Final   Culture   Final    NO GROWTH 2 DAYS Performed at [facility], [address] [place], [place] [address]    Report Status PENDING  Incomplete     Radiology Studies: DG Chest Portable 1 View  Result Date: 09/21/2020 CLINICAL DATA:  49 year old female with shortness of breath. EXAM: PORTABLE CHEST 1 VIEW COMPARISON:  None. FINDINGS: Bilateral confluent airspace opacities most consistent with multifocal pneumonia, likely viral or atypical in etiology including COVID-19. Clinical correlation is recommended. There is no pleural effusion pneumothorax. The cardiac silhouette is within limits. No acute osseous pathology. IMPRESSION: Multifocal pneumonia. Electronically Signed   By: Elgie Collard M.D.   On: 09/21/2020 21:08   Scheduled Meds: . vitamin C  500 mg Oral Daily  . baricitinib  4 mg Oral Daily  . dexamethasone (DECADRON) injection  6 mg Intravenous Q24H  . enoxaparin (LOVENOX) injection  60 mg Subcutaneous Q12H  . famotidine  20 mg Oral QHS  . Ipratropium-Albuterol  1 puff Inhalation TID  . zinc sulfate  220 mg Oral Daily   Continuous Infusions: . remdesivir 100 mg in NS 100 mL Stopped (09/23/20 1020)    LOS: 2 days   Time spent: 31 mins   Tyrianna Lightle Laural Benes, MD How to contact the [facility] Attending or Consulting provider 7A - 7P or covering provider during after hours 7P -7A, for this patient?  1. Check the care team in [facility] and look for a) attending/consulting TRH provider listed and b) the [facility] team listed 2. Log into [email or web address] and use [place] [place]'s universal password to access. If you do not have the password, please contact the hospital operator. 3. Locate the [facility] provider  you are looking for under [facility] and page to a number that you can be directly reached. 4. If  you still have difficulty reaching the provider, please page the [facility] (Director on Call) for the Hospitalists listed on amion for assistance.  09/23/2020, 2:10 PM

## 2020-09-24 LAB — COMPREHENSIVE METABOLIC PANEL
ALT: 43 U/L (ref 0–44)
AST: 34 U/L (ref 15–41)
Albumin: 2.8 g/dL — ABNORMAL LOW (ref 3.5–5.0)
Alkaline Phosphatase: 66 U/L (ref 38–126)
Anion gap: 11 (ref 5–15)
BUN: 18 mg/dL (ref 6–20)
CO2: 28 mmol/L (ref 22–32)
Calcium: 8.6 mg/dL — ABNORMAL LOW (ref 8.9–10.3)
Chloride: 101 mmol/L (ref 98–111)
Creatinine, Ser: 0.64 mg/dL (ref 0.44–1.00)
GFR, Estimated: >60 mL/min (ref >60)
Glucose, Bld: 125 mg/dL — ABNORMAL HIGH (ref 70–99)
Potassium: 3.5 mmol/L (ref 3.5–5.1)
Sodium: 140 mmol/L (ref 135–145)
Total Bilirubin: 0.5 mg/dL (ref 0.3–1.2)
Total Protein: 5.8 g/dL — ABNORMAL LOW (ref 6.5–8.1)

## 2020-09-24 LAB — CBC WITH DIFFERENTIAL/PLATELET
Abs Immature Granulocytes: 0.19 10*3/uL — ABNORMAL HIGH (ref 0.00–0.07)
Basophils Absolute: 0 10*3/uL (ref 0.0–0.1)
Basophils Relative: 0 %
Eosinophils Absolute: 0 10*3/uL (ref 0.0–0.5)
Eosinophils Relative: 0 %
HCT: 38.9 % (ref 36.0–46.0)
Hemoglobin: 12.8 g/dL (ref 12.0–15.0)
Immature Granulocytes: 3 %
Lymphocytes Relative: 13 %
Lymphs Abs: 1 10*3/uL (ref 0.7–4.0)
MCH: 30.3 pg (ref 26.0–34.0)
MCHC: 32.9 g/dL (ref 30.0–36.0)
MCV: 92.2 fL (ref 80.0–100.0)
Monocytes Absolute: 0.5 10*3/uL (ref 0.1–1.0)
Monocytes Relative: 7 %
Neutro Abs: 5.9 10*3/uL (ref 1.7–7.7)
Neutrophils Relative %: 77 %
Platelets: 346 10*3/uL (ref 150–400)
RBC: 4.22 MIL/uL (ref 3.87–5.11)
RDW: 12 % (ref 11.5–15.5)
WBC: 7.6 10*3/uL (ref 4.0–10.5)
nRBC: 0 % (ref 0.0–0.2)

## 2020-09-24 LAB — C-REACTIVE PROTEIN: CRP: 6.2 mg/dL — ABNORMAL HIGH (ref <1.0)

## 2020-09-24 LAB — PHOSPHORUS: Phosphorus: 3.2 mg/dL (ref 2.5–4.6)

## 2020-09-24 LAB — FERRITIN: Ferritin: 191 ng/mL (ref 11–307)

## 2020-09-24 LAB — MAGNESIUM: Magnesium: 2.3 mg/dL (ref 1.7–2.4)

## 2020-09-24 LAB — D-DIMER, QUANTITATIVE: D-Dimer, Quant: 0.83 ug/mL-FEU — ABNORMAL HIGH (ref 0.00–0.50)

## 2020-09-24 MED ORDER — ZINC SULFATE 220 (50 ZN) MG PO CAPS
220.0000 mg | ORAL_CAPSULE | Freq: Every day | ORAL | 0 refills | Status: DC
Start: 2020-09-24 — End: 2021-07-04

## 2020-09-24 MED ORDER — FAMOTIDINE 20 MG PO TABS: 20.0000 mg | ORAL_TABLET | Freq: Every day | ORAL | 0 refills | Status: DC

## 2020-09-24 MED ORDER — ACETAMINOPHEN 325 MG PO TABS: 650.0000 mg | ORAL_TABLET | Freq: Four times a day (QID) | ORAL | Status: AC | PRN

## 2020-09-24 MED ORDER — DEXAMETHASONE 6 MG PO TABS: 6.0000 mg | ORAL_TABLET | Freq: Every day | ORAL | 0 refills | Status: AC

## 2020-09-24 MED ORDER — GUAIFENESIN-DM 100-10 MG/5ML PO SYRP: 10.0000 mL | ORAL_SOLUTION | ORAL | 0 refills | Status: DC | PRN

## 2020-09-24 MED ORDER — ASCORBIC ACID 500 MG PO TABS: 500.0000 mg | ORAL_TABLET | Freq: Every day | ORAL | 0 refills | Status: AC

## 2020-09-24 NOTE — TOC Transition Note (Signed)
Transition of Care [facility]) - CM/SW Discharge Note   Patient Details  Name: Kathleen Massey MRN: [id number] Date of Birth: August 13, 1971  [facility] CM/SW Contact:  Annice Needy, LCSW Phone Number: 09/24/2020, 10:11 AM   Clinical Narrative:    Patient from home. COVID+. Independent at baseline. Has moved and new address is [address], [place], [place] [address]. Chart updated. DME suppliers discussed. Referral made to [facility] for Oxygen.   Final next level of care: Home/Self Care Barriers to Discharge: No Barriers Identified   Patient Goals and CMS Choice        Discharge Placement                       Discharge Plan and Services                DME Arranged: Oxygen DME Agency: [employer] Date DME Agency Contacted: 09/24/20 Time DME Agency Contacted: [phone] Representative spoke with at DME Agency: [place]            Social Determinants of Health (SDOH) Interventions     Readmission Risk Interventions No flowsheet data found.

## 2020-09-24 NOTE — Discharge Instructions (Signed)
Patient scheduled for outpatient Remdesivir infusions at 1pm on Wednesday 10/20 at [facility]. Please inform the patient to park at [address], [place], as staff will be escorting the patient through the east entrance of the hospital. Appointments take approximately 45 minutes.    There is a wave flag banner located near the entrance on [address] Turn into this entrance and immediately turn left or right and park in 1 of the 10 designated Covid Infusion Parking spots. There is a phone number on the sign, please call and let the staff know what spot you are in and we will come out and get you. For questions call [phone].  Thanks.      COVID-19 Frequently Asked Questions COVID-19 (coronavirus disease) is an infection that is caused by a large family of viruses. Some viruses cause illness in people and others cause illness in animals like camels, cats, and bats. In some cases, the viruses that cause illness in animals can spread to humans. Where did the coronavirus come from? In December 2019, Armeniahina told the [employer] [facility](WHO) of several cases of lung disease (human respiratory illness). These cases were linked to an open seafood and livestock market in the city of [place]. The link to the seafood and livestock market suggests that the virus may have spread from animals to humans. However, since that first outbreak in December, the virus has also been shown to spread from person to person. What is the name of the disease and the virus? Disease name Early on, this disease was called novel coronavirus. This is because scientists determined that the disease was caused by a new (novel) respiratory virus. The World Health Organization [facility](WHO) has now named the disease COVID-19, or coronavirus disease. Virus name The virus that causes the disease is called severe acute respiratory syndrome coronavirus 2 (SARS-CoV-2). More information on disease and virus naming World Health  Organization [facility](WHO): www.who.int/emergencies/diseases/novel-coronavirus-2019/technical-guidance/naming-the-coronavirus-disease-(covid-2019)-and-the-virus-that-causes-it Who is at risk for complications from coronavirus disease? Some people may be at higher risk for complications from coronavirus disease. This includes older adults and people who have chronic diseases, such as heart disease, diabetes, and lung disease. If you are at higher risk for complications, take these extra precautions:  Stay home as much as possible.  Avoid social gatherings and travel.  Avoid close contact with others. Stay at least 6 ft (2 m) away from others, if possible.  Wash your hands often with soap and water for at least 20 seconds.  Avoid touching your face, mouth, nose, or eyes.  Keep supplies on hand at home, such as food, medicine, and cleaning supplies.  If you must go out in public, wear a cloth face covering or face mask. Make sure your mask covers your nose and mouth. How does coronavirus disease spread? The virus that causes coronavirus disease spreads easily from person to person (is contagious). You may catch the virus by:  Breathing in droplets from an infected person. Droplets can be spread by a person breathing, speaking, singing, coughing, or sneezing.  Touching something, like a table or a doorknob, that was exposed to the virus (contaminated) and then touching your mouth, nose, or eyes. Can I get the virus from touching surfaces or objects? There is still a lot that we do not know about the virus that causes coronavirus disease. Scientists are basing a lot of information on what they know about similar viruses, such as:  Viruses cannot generally survive on surfaces for long. They  need a human body (host) to survive.  It is more likely that the virus is spread by close contact with people who are sick (direct contact), such as through: ? Shaking hands or hugging. ? Breathing in respiratory  droplets that travel through the air. Droplets can be spread by a person breathing, speaking, singing, coughing, or sneezing.  It is less likely that the virus is spread when a person touches a surface or object that has the virus on it (indirect contact). The virus may be able to enter the body if the person touches a surface or object and then touches his or her face, eyes, nose, or mouth. Can a person spread the virus without having symptoms of the disease? It may be possible for the virus to spread before a person has symptoms of the disease, but this is most likely not the main way the virus is spreading. It is more likely for the virus to spread by being in close contact with people who are sick and breathing in the respiratory droplets spread by a person breathing, speaking, singing, coughing, or sneezing. What are the symptoms of coronavirus disease? Symptoms vary from person to person and can range from mild to severe. Symptoms may include:  Fever or chills.  Cough.  Difficulty breathing or feeling short of breath.  Headaches, body aches, or muscle aches.  Runny or stuffy (congested) nose.  Sore throat.  New loss of taste or smell.  Nausea, vomiting, or diarrhea. These symptoms can appear anywhere from 2 to 14 days after you have been exposed to the virus. Some people may not have any symptoms. If you develop symptoms, call your health care provider. People with severe symptoms may need hospital care. Should I be tested for this virus? Your health care provider will decide whether to test you based on your symptoms, history of exposure, and your risk factors. How does a health care provider test for this virus? Health care providers will collect samples to send for testing. Samples may include:  Taking a swab of fluid from the back of your nose and throat, your nose, or your throat.  Taking fluid from the lungs by having you cough up mucus (sputum) into a sterile  cup.  Taking a blood sample. Is there a treatment or vaccine for this virus? Currently, there is no vaccine to prevent coronavirus disease. Also, there are no medicines like antibiotics or antivirals to treat the virus. A person who becomes sick is given supportive care, which means rest and fluids. A person may also relieve his or her symptoms by using over-the-counter medicines that treat sneezing, coughing, and runny nose. These are the same medicines that a person takes for the common cold. If you develop symptoms, call your health care provider. People with severe symptoms may need hospital care. What can I do to protect myself and my family from this virus?     You can protect yourself and your family by taking the same actions that you would take to prevent the spread of other viruses. Take the following actions:  Wash your hands often with soap and water for at least 20 seconds. If soap and water are not available, use alcohol-based hand sanitizer.  Avoid touching your face, mouth, nose, or eyes.  Cough or sneeze into a tissue, sleeve, or elbow. Do not cough or sneeze into your hand or the air. ? If you cough or sneeze into a tissue, throw it away immediately and wash your  hands.  Disinfect objects and surfaces that you frequently touch every day.  Stay away from people who are sick.  Avoid going out in public, follow guidance from your state and local health authorities.  Avoid crowded indoor spaces. Stay at least 6 ft (2 m) away from others.  If you must go out in public, wear a cloth face covering or face mask. Make sure your mask covers your nose and mouth.  Stay home if you are sick, except to get medical care. Call your health care provider before you get medical care. Your health care provider will tell you how long to stay home.  Make sure your vaccines are up to date. Ask your health care provider what vaccines you need. What should I do if I need to travel? Follow  travel recommendations from your local health authority, the CDC, and WHO. Travel information and advice  Centers for Disease Control and Prevention (CDC): GeminiCard.gl  World Health Organization [facility]): PreviewDomains.se Know the risks and take action to protect your health  You are at higher risk of getting coronavirus disease if you are traveling to areas with an outbreak or if you are exposed to travelers from areas with an outbreak.  Wash your hands often and practice good hygiene to lower the risk of catching or spreading the virus. What should I do if I am sick? General instructions to stop the spread of infection  Wash your hands often with soap and water for at least 20 seconds. If soap and water are not available, use alcohol-based hand sanitizer.  Cough or sneeze into a tissue, sleeve, or elbow. Do not cough or sneeze into your hand or the air.  If you cough or sneeze into a tissue, throw it away immediately and wash your hands.  Stay home unless you must get medical care. Call your health care provider or local health authority before you get medical care.  Avoid public areas. Do not take public transportation, if possible.  If you can, wear a mask if you must go out of the house or if you are in close contact with someone who is not sick. Make sure your mask covers your nose and mouth. Keep your home clean  Disinfect objects and surfaces that are frequently touched every day. This may include: ? Counters and tables. ? Doorknobs and light switches. ? Sinks and faucets. ? Electronics such as phones, remote controls, keyboards, computers, and tablets.  Wash dishes in hot, soapy water or use a dishwasher. Air-dry your dishes.  Wash laundry in hot water. Prevent infecting other household members  Let healthy household members care for children and pets, if possible. If you have  to care for children or pets, wash your hands often and wear a mask.  Sleep in a different bedroom or bed, if possible.  Do not share personal items, such as razors, toothbrushes, deodorant, combs, brushes, towels, and washcloths. Where to find more information Centers for Disease Control and Prevention (CDC)  Information and news updates: CardRetirement.cz World Health Organization [facility])  Information and news updates: AffordableSalon.es  Coronavirus health topic: https://thompson-craig.com/  Questions and answers on COVID-19: kruiseway.com  Global tracker: who.sprinklr.com American Academy of Pediatrics (AAP)  Information for families: www.healthychildren.org/English/health-issues/conditions/chest-lungs/Pages/2019-Novel-Coronavirus.aspx The coronavirus situation is changing rapidly. Check your local health authority website or the CDC and [facility] websites for updates and news. When should I contact a health care provider?  Contact your health care provider if you have symptoms of an infection, such as  fever or cough, and you: ? Have been near anyone who is known to have coronavirus disease. ? Have come into contact with a person who is suspected to have coronavirus disease. ? Have traveled to an area where there is an outbreak of COVID-19. When should I get emergency medical care?  Get help right away by calling your local emergency services (911 in the [place] if you have: ? Trouble breathing. ? Pain or pressure in your chest. ? Confusion. ? Blue-tinged lips and fingernails. ? Difficulty waking from sleep. ? Symptoms that get worse. Let the emergency medical personnel know if you think you have coronavirus disease. Summary  A new respiratory virus is spreading from person to person and causing COVID-19 (coronavirus disease).  The virus that causes COVID-19 appears to spread  easily. It spreads from one person to another through droplets from breathing, speaking, singing, coughing, or sneezing.  Older adults and those with chronic diseases are at higher risk of disease. If you are at higher risk for complications, take extra precautions.  There is currently no vaccine to prevent coronavirus disease. There are no medicines, such as antibiotics or antivirals, to treat the virus.  You can protect yourself and your family by washing your hands often, avoiding touching your face, and covering your coughs and sneezes. This information is not intended to replace advice given to you by your health care provider. Make sure you discuss any questions you have with your health care provider. Document Revised: 09/22/2019 Document Reviewed: 03/21/2019 Elsevier Patient Education  2020 Elsevier Inc.  10 Things You Can Do to Manage Your COVID-19 Symptoms at Home If you have possible or confirmed COVID-19: 1. Stay home from work and school. And stay away from other public places. If you must go out, avoid using any kind of public transportation, ridesharing, or taxis. 2. Monitor your symptoms carefully. If your symptoms get worse, call your healthcare provider immediately. 3. Get rest and stay hydrated. 4. If you have a medical appointment, call the healthcare provider ahead of time and tell them that you have or may have COVID-19. 5. For medical emergencies, call 911 and notify the dispatch personnel that you have or may have COVID-19. 6. Cover your cough and sneezes with a tissue or use the inside of your elbow. 7. Wash your hands often with soap and water for at least 20 seconds or clean your hands with an alcohol-based hand sanitizer that contains at least 60% alcohol. 8. As much as possible, stay in a specific room and away from other people in your home. Also, you should use a separate bathroom, if available. If you need to be around other people in or outside of the home, wear  a mask. 9. Avoid sharing personal items with other people in your household, like dishes, towels, and bedding. 10. Clean all surfaces that are touched often, like counters, tabletops, and doorknobs. Use household cleaning sprays or wipes according to the label instructions. SouthAmericaFlowers.co.uk 06/07/2019 This information is not intended to replace advice given to you by your health care provider. Make sure you discuss any questions you have with your health care provider. Document Revised: 11/09/2019 Document Reviewed: 11/09/2019 Elsevier Patient Education  2020 Elsevier Inc.  COVID-19: How to Protect Yourself and Others Know how it spreads  There is currently no vaccine to prevent coronavirus disease 2019 (COVID-19).  The best way to prevent illness is to avoid being exposed to this virus.  The virus is thought to spread mainly  from person-to-person. ? Between people who are in close contact with one another (within about 6 feet). ? Through respiratory droplets produced when an infected person coughs, sneezes or talks. ? These droplets can land in the mouths or noses of people who are nearby or possibly be inhaled into the lungs. ? COVID-19 may be spread by people who are not showing symptoms. Everyone should Clean your hands often  Wash your hands often with soap and water for at least 20 seconds especially after you have been in a public place, or after blowing your nose, coughing, or sneezing.  If soap and water are not readily available, use a hand sanitizer that contains at least 60% alcohol. Cover all surfaces of your hands and rub them together until they feel dry.  Avoid touching your eyes, nose, and mouth with unwashed hands. Avoid close contact  Limit contact with others as much as possible.  Avoid close contact with people who are sick.  Put distance between yourself and other people. ? Remember that some people without symptoms may be able to spread virus. ? This is  especially important for people who are at higher risk of getting very RetroStamps.it Cover your mouth and nose with a mask when around others  You could spread COVID-19 to others even if you do not feel sick.  Everyone should wear a mask in public settings and when around people not living in their household, especially when social distancing is difficult to maintain. ? Masks should not be placed on young children under age 29, anyone who has trouble breathing, or is unconscious, incapacitated or otherwise unable to remove the mask without assistance.  The mask is meant to protect other people in case you are infected.  Do NOT use a facemask meant for a Research scientist (physical sciences).  Continue to keep about 6 feet between yourself and others. The mask is not a substitute for social distancing. Cover coughs and sneezes  Always cover your mouth and nose with a tissue when you cough or sneeze or use the inside of your elbow.  Throw used tissues in the trash.  Immediately wash your hands with soap and water for at least 20 seconds. If soap and water are not readily available, clean your hands with a hand sanitizer that contains at least 60% alcohol. Clean and disinfect  Clean AND disinfect frequently touched surfaces daily. This includes tables, doorknobs, light switches, countertops, handles, desks, phones, keyboards, toilets, faucets, and sinks. ktimeonline.com  If surfaces are dirty, clean them: Use detergent or soap and water prior to disinfection.  Then, use a household disinfectant. You can see a list of EPA-registered household disinfectants here. SouthAmericaFlowers.co.uk 08/09/2019 This information is not intended to replace advice given to you by your health care provider. Make sure you discuss any questions you have with your health care provider. Document  Revised: 08/17/2019 Document Reviewed: 06/15/2019 Elsevier Patient Education  2020 Elsevier Inc.   IMPORTANT INFORMATION: PAY CLOSE ATTENTION   PHYSICIAN DISCHARGE INSTRUCTIONS  Follow with Primary care provider  Assunta Found, MD  and other consultants as instructed by your Hospitalist Physician  SEEK MEDICAL CARE OR RETURN TO EMERGENCY ROOM IF SYMPTOMS COME BACK, WORSEN OR NEW PROBLEM DEVELOPS   Please note: You were cared for by a hospitalist during your hospital stay. Every effort will be made to forward records to your primary care provider.  You can request that your primary care provider send for your hospital records if they have not received them.  Once you are discharged, your primary care physician will handle any further medical issues. Please note that NO REFILLS for any discharge medications will be authorized once you are discharged, as it is imperative that you return to your primary care physician (or establish a relationship with a primary care physician if you do not have one) for your post hospital discharge needs so that they can reassess your need for medications and monitor your lab values.  Please get a complete blood count and chemistry panel checked by your Primary MD at your next visit, and again as instructed by your Primary MD.  Get Medicines reviewed and adjusted: Please take all your medications with you for your next visit with your Primary MD  Laboratory/radiological data: Please request your Primary MD to go over all hospital tests and procedure/radiological results at the follow up, please ask your primary care provider to get all Hospital records sent to his/her office.  In some cases, they will be blood work, cultures and biopsy results pending at the time of your discharge. Please request that your primary care provider follow up on these results.  If you are diabetic, please bring your blood sugar readings with you to your follow up appointment with  primary care.    Please call and make your follow up appointments as soon as possible.    Also Note the following: If you experience worsening of your admission symptoms, develop shortness of breath, life threatening emergency, suicidal or homicidal thoughts you must seek medical attention immediately by calling 911 or calling your MD immediately  if symptoms less severe.  You must read complete instructions/literature along with all the possible adverse reactions/side effects for all the Medicines you take and that have been prescribed to you. Take any new Medicines after you have completely understood and accpet all the possible adverse reactions/side effects.   Do not drive when taking Pain medications or sleeping medications (Benzodiazepines)  Do not take more than prescribed Pain, Sleep and Anxiety Medications. It is not advisable to combine anxiety,sleep and pain medications without talking with your primary care practitioner  Special Instructions: If you have smoked or chewed Tobacco  in the last 2 yrs please stop smoking, stop any regular Alcohol  and or any Recreational drug use.  Wear Seat belts while driving.  Do not drive if taking any narcotic, mind altering or controlled substances or recreational drugs or alcohol.

## 2020-09-24 NOTE — Discharge Summary (Signed)
Physician Discharge Summary  Kathleen Massey ZOX:[id number] DOB: [birth year] DOA: 09/21/2020  PCP: Assunta Found, MD  Admit date: 09/21/2020 Discharge date: 09/24/2020  Admitted From:  HOME  Disposition:  HOME   Recommendations for Outpatient Follow-up:  1. Follow up with PCP in 2 weeks 2. Outpatient remdesivir infusion as scheduled  Patient scheduled for outpatient Remdesivir infusions at 1pm on Wednesday 10/20 at [facility]. Please inform the patient to park at[address], [place], as staff will be escorting the patient through the east entrance of the hospital.Appointments take approximately 45 minutes.   There is a wave flag banner located near the entrance on [address]. Turn into this entranceand immediatelyturn left or right and park in 1 of the 10 designated Covid Infusion Parking spots. There is a phone number on the sign, please call and let the staff know what spot you are in and we will come out and get you. For questions call [phone]. Thanks.  Brief Admission History:  49 y.o.female,with history of obesity and GERD presents to the ER with a chief complaint of dyspnea. Patient reports that 11 days ago she thought she was having sinus infection type symptoms. She describes them as sinus congestion, rhinorrhea, headaches, and mild cough. The symptoms then progressed to body aches violent cough that caused her to be short of breath and some exertional shortness of breath. Patient does not think she was running a fever. She has not lost her taste or smell. She has not had any nausea vomiting or diarrhea. Patient went into get tested for Covid. Results came back positive this morning. She borrowed somebody's pulse ox so that she could monitor her oxygen as recommended by a health professional. She reports that her oxygen was 80% so she knew she needed to come into the ER. When EMS picked her up they also report that her oxygen was in the 80s. They  started her on 6 L nasal cannula. When she got to the ER she was increased to 8 L nasal cannula because she was only satting at 91% with EMS. Patient was tachypneic on arrival, but that may have been anxiety related at this time she is resting comfortably, breathing to normal rate, saturating at 99% on 6 L nasal cannula. Patient is not vaccinated for Covid, she does not smoke, and she is full code.  Hospital Course   Active Problems:   Respiratory failure with hypoxia (HCC)   Pneumonia due to COVID-19 virus  1. Acute respiratory failure with hypoxia secondary to Covid pneumonia - Pt presented with low oxygen saturation in 80s and multifocal pneumonia on chest xray. Her inflammatory markers are coming down.  She was treated with remdesivir, decadron, baricitinib. Her oxygen is now weaned down to 4L and she is feeling much better.  DC home with supplemental oxygen, scheduled for remaining 1 outpatient remdesivir infusion.  Complete 10 day course of steroids.  2. Hypokalemia - repleted.  3. Moderate protein calorie malnutrition - from poor oral intake over past several days prior to arrival.   4. Elevated d dimer - enoxaparin 0.5 mg/kg every 12 hours given in hospital.   5. Morbid obesity BMI >45.    DVT prophylaxis: enoxaparin Code Status: full  Family Communication: call to spouse no answer  Disposition:  Home   Home Health:  DME Home Oxygen   Discharge Condition: STABLE   CODE STATUS: FULL    Discharge Diagnoses:  Active Problems:   Respiratory failure with hypoxia (HCC)  Pneumonia due to COVID-19 virus  Discharge Instructions:  Allergies as of 09/24/2020   No Known Allergies     Medication List    STOP taking these medications   ciprofloxacin 500 MG tablet Commonly known as: CIPRO   Zantac 150 MG tablet Generic drug: ranitidine Replaced by: famotidine 20 MG tablet     TAKE these medications   acetaminophen 325 MG tablet Commonly known as: TYLENOL Take 2  tablets (650 mg total) by mouth every 6 (six) hours as needed for mild pain or headache (fever >/= 101).   ALPRAZolam 0.5 MG tablet Commonly known as: XANAX Take 0.5 mg by mouth at bedtime as needed.   ascorbic acid 500 MG tablet Commonly known as: VITAMIN C Take 1 tablet (500 mg total) by mouth daily.   cyclobenzaprine 10 MG tablet Commonly known as: FLEXERIL Take 10 mg by mouth 2 (two) times daily as needed for muscle spasms.   dexamethasone 6 MG tablet Commonly known as: DECADRON Take 1 tablet (6 mg total) by mouth daily for 6 days. Start taking on: September 25, 2020   estradiol 1 MG tablet Commonly known as: ESTRACE Take 1 mg by mouth daily.   famotidine 20 MG tablet Commonly known as: PEPCID Take 1 tablet (20 mg total) by mouth at bedtime. Replaces: Zantac 150 MG tablet   guaiFENesin-dextromethorphan 100-10 MG/5ML syrup Commonly known as: ROBITUSSIN DM Take 10 mLs by mouth every 4 (four) hours as needed for cough.   medroxyPROGESTERone 5 MG tablet Commonly known as: PROVERA Take 5 mg by mouth daily.   progesterone 100 MG capsule Commonly known as: PROMETRIUM Take 100 mg by mouth at bedtime.   zinc sulfate 220 (50 Zn) MG capsule Take 1 capsule (220 mg total) by mouth daily.            Durable Medical Equipment  (From admission, onward)         Start     Ordered   09/24/20 0847  For home use only DME oxygen  Once       Question Answer Comment  Length of Need 6 Months   Mode or (Route) Nasal cannula   Liters per Minute 4   Frequency Continuous (stationary and portable oxygen unit needed)   Oxygen conserving device Yes   Oxygen delivery system Gas      09/24/20 0846          Follow-up Information    Assunta Found, MD. Schedule an appointment as soon as possible for a visit in 1 week(s).   Specialty: Family Medicine Contact information: [address] [place] [address] [phone]              No Known Allergies Allergies as  of 09/24/2020   No Known Allergies     Medication List    STOP taking these medications   ciprofloxacin 500 MG tablet Commonly known as: CIPRO   Zantac 150 MG tablet Generic drug: ranitidine Replaced by: famotidine 20 MG tablet     TAKE these medications   acetaminophen 325 MG tablet Commonly known as: TYLENOL Take 2 tablets (650 mg total) by mouth every 6 (six) hours as needed for mild pain or headache (fever >/= 101).   ALPRAZolam 0.5 MG tablet Commonly known as: XANAX Take 0.5 mg by mouth at bedtime as needed.   ascorbic acid 500 MG tablet Commonly known as: VITAMIN C Take 1 tablet (500 mg total) by mouth daily.   cyclobenzaprine 10 MG tablet Commonly  known as: FLEXERIL Take 10 mg by mouth 2 (two) times daily as needed for muscle spasms.   dexamethasone 6 MG tablet Commonly known as: DECADRON Take 1 tablet (6 mg total) by mouth daily for 6 days. Start taking on: September 25, 2020   estradiol 1 MG tablet Commonly known as: ESTRACE Take 1 mg by mouth daily.   famotidine 20 MG tablet Commonly known as: PEPCID Take 1 tablet (20 mg total) by mouth at bedtime. Replaces: Zantac 150 MG tablet   guaiFENesin-dextromethorphan 100-10 MG/5ML syrup Commonly known as: ROBITUSSIN DM Take 10 mLs by mouth every 4 (four) hours as needed for cough.   medroxyPROGESTERone 5 MG tablet Commonly known as: PROVERA Take 5 mg by mouth daily.   progesterone 100 MG capsule Commonly known as: PROMETRIUM Take 100 mg by mouth at bedtime.   zinc sulfate 220 (50 Zn) MG capsule Take 1 capsule (220 mg total) by mouth daily.            Durable Medical Equipment  (From admission, onward)         Start     Ordered   09/24/20 0847  For home use only DME oxygen  Once       Question Answer Comment  Length of Need 6 Months   Mode or (Route) Nasal cannula   Liters per Minute 4   Frequency Continuous (stationary and portable oxygen unit needed)   Oxygen conserving device Yes    Oxygen delivery system Gas      09/24/20 0846          Procedures/Studies: DG Chest Portable 1 View  Result Date: 09/21/2020 CLINICAL DATA:  49 year old female with shortness of breath. EXAM: PORTABLE CHEST 1 VIEW COMPARISON:  None. FINDINGS: Bilateral confluent airspace opacities most consistent with multifocal pneumonia, likely viral or atypical in etiology including COVID-19. Clinical correlation is recommended. There is no pleural effusion pneumothorax. The cardiac silhouette is within limits. No acute osseous pathology. IMPRESSION: Multifocal pneumonia. Electronically Signed   By: Elgie CollardArash  Radparvar M.D.   On: 09/21/2020 21:08      Subjective: Pt reports that she is feeling better today.  Her oxygen is weaned down to 4L now and she is tolerating it well.  She remains with cough.  She is ambulating.    Discharge Exam: Vitals:   09/24/20 0417 09/24/20 0832  BP: 128/61   Pulse:    Resp: 18   Temp: 98.9 F (37.2 C)   SpO2: 100% 98%   Vitals:   09/23/20 1846 09/23/20 1958 09/24/20 0417 09/24/20 0832  BP:  132/71 128/61   Pulse:  65    Resp:  16 18   Temp:  98.2 F (36.8 C) 98.9 F (37.2 C)   TempSrc:  Oral Oral   SpO2: 92% 96% 100% 98%  Weight:      Height:       General exam: Appears calm and comfortable  Respiratory system: No increased work of breathing.   Cardiovascular system: S1 & S2 heard, RRR. No JVD, murmurs, rubs, gallops or clicks. No pedal edema. Gastrointestinal system: Abdomen is nondistended, soft and nontender. No organomegaly or masses felt. Normal bowel sounds heard. Central nervous system: Alert and oriented. No focal neurological deficits. Extremities: Symmetric 5 x 5 power. Skin: No rashes, lesions or ulcers Psychiatry: Judgement and insight appear normal. Mood & affect appropriate.    The results of significant diagnostics from this hospitalization (including imaging, microbiology, ancillary and laboratory) are listed below for  reference.      Microbiology: Recent Results (from the past 240 hour(s))  Respiratory Panel by RT PCR (Flu A&B, Covid) - Nasopharyngeal Swab     Status: Abnormal   Collection Time: 09/21/20  8:12 PM   Specimen: Nasopharyngeal Swab  Result Value Ref Range Status   SARS Coronavirus 2 by RT PCR POSITIVE (A) NEGATIVE Final    Comment: RESULT CALLED TO, READ BACK BY AND VERIFIED WITH: Lockie Pares AT 2132 ON 09/21/2020 BY MOSLEY,J (NOTE) SARS-CoV-2 target nucleic acids are DETECTED.  SARS-CoV-2 RNA is generally detectable in upper respiratory specimens  during the acute phase of infection. Positive results are indicative of the presence of the identified virus, but do not rule out bacterial infection or co-infection with other pathogens not detected by the test. Clinical correlation with patient history and other diagnostic information is necessary to determine patient infection status. The expected result is Negative.  Fact Sheet for Patients:  https://www.moore.com/  Fact Sheet for Healthcare Providers: https://www.young.biz/  This test is not yet approved or cleared by the [place] FDA and  has been authorized for detection and/or diagnosis of SARS-CoV-2 by FDA under an Emergency Use Authorization (EUA).  This EUA will remain in effect (meaning this test  can be used) for the duration of  the COVID-19 declaration under Section 564(b)(1) of the Act, 21 U.S.C. section 360bbb-3(b)(1), unless the authorization is terminated or revoked sooner.      Influenza A by PCR NEGATIVE NEGATIVE Final   Influenza B by PCR NEGATIVE NEGATIVE Final    Comment: (NOTE) The Xpert Xpress SARS-CoV-2/FLU/RSV assay is intended as an aid in  the diagnosis of influenza from Nasopharyngeal swab specimens and  should not be used as a sole basis for treatment. Nasal washings and  aspirates are unacceptable for Xpert Xpress SARS-CoV-2/FLU/RSV  testing.  Fact Sheet for  Patients: https://www.moore.com/  Fact Sheet for Healthcare Providers: https://www.young.biz/  This test is not yet approved or cleared by the [place] FDA and  has been authorized for detection and/or diagnosis of SARS-CoV-2 by  FDA under an Emergency Use Authorization (EUA). This EUA will remain  in effect (meaning this test can be used) for the duration of the  Covid-19 declaration under Section 564(b)(1) of the Act, 21  U.S.C. section 360bbb-3(b)(1), unless the authorization is  terminated or revoked. Performed at [facility], [address] [place], [place] [address]   Blood Culture (routine x 2)     Status: None (Preliminary result)   Collection Time: 09/21/20  8:15 PM   Specimen: BLOOD  Result Value Ref Range Status   Specimen Description BLOOD  Final   Special Requests NONE  Final   Culture   Final    NO GROWTH 3 DAYS Performed at [facility], [address] [place], [place] [address]    Report Status PENDING  Incomplete  Blood Culture (routine x 2)     Status: None (Preliminary result)   Collection Time: 09/21/20  8:30 PM   Specimen: BLOOD  Result Value Ref Range Status   Specimen Description BLOOD  Final   Special Requests NONE  Final   Culture   Final    NO GROWTH 3 DAYS Performed at [facility], [address] [place], [place] [address]    Report Status PENDING  Incomplete     Labs: BNP (last 3 results) No results for input(s): BNP in the last 8760 hours. Basic Metabolic Panel: Recent Labs  Lab 09/21/20 2015 09/22/20 0451  09/23/20 0618 09/24/20 0718  NA 133* 141 139 140  K 3.0* 3.6 3.4* 3.5  CL 98 97* 99 101  CO2 GLUCOSE 148* 161* 143* 125*  BUN CREATININE 0.64 0.62 0.64 0.64  CALCIUM 8.4* 9.3 8.7* 8.6*  MG  --  1.9 2.3 2.3  PHOS  --  2.7 2.7 3.2   Liver Function Tests: Recent Labs  Lab 09/21/20 2015 09/22/20 0451 09/23/20 0618 09/24/20 0718  AST 84* 81* 51* 34  ALT  63* 62* 51* 43  ALKPHOS 90 95 76 66  BILITOT 0.7 0.4 0.4 0.5  PROT 6.3* 6.4* 6.0* 5.8*  ALBUMIN 3.2* 3.1* 2.9* 2.8*   No results for input(s): LIPASE, AMYLASE in the last 168 hours. No results for input(s): AMMONIA in the last 168 hours. CBC: Recent Labs  Lab 09/21/20 2015 09/22/20 0451 09/23/20 0618 09/24/20 0718  WBC 4.3 4.7 5.3 7.6  NEUTROABS 3.4 3.9 4.1 5.9  HGB 13.1 13.2 12.9 12.8  HCT 38.1 40.2 39.5 38.9  MCV 89.4 92.0 92.1 92.2  PLT 215 236 299 346   Cardiac Enzymes: No results for input(s): CKTOTAL, CKMB, CKMBINDEX, TROPONINI in the last 168 hours. BNP: Invalid input(s): POCBNP CBG: No results for input(s): GLUCAP in the last 168 hours. D-Dimer Recent Labs    09/23/20 0618 09/24/20 0718  DDIMER 0.91* 0.83*   Hgb A1c No results for input(s): HGBA1C in the last 72 hours. Lipid Profile Recent Labs    09/21/20 2015  TRIG 72   Thyroid function studies No results for input(s): TSH, T4TOTAL, T3FREE, THYROIDAB in the last 72 hours.  Invalid input(s): FREET3 Anemia work up Recent Labs    09/23/20 0618 09/24/20 0718  FERRITIN 321* 191   Urinalysis    Component Value Date/Time   COLORURINE YELLOW 03/05/2013 0010   APPEARANCEUR CLEAR 03/05/2013 0010   LABSPEC >1.030 (H) 03/05/2013 0010   PHURINE 5.0 03/05/2013 0010   GLUCOSEU NEGATIVE 03/05/2013 0010   HGBUR SMALL (A) 03/05/2013 0010   BILIRUBINUR NEGATIVE 03/05/2013 0010   KETONESUR NEGATIVE 03/05/2013 0010   PROTEINUR NEGATIVE 03/05/2013 0010   UROBILINOGEN 0.2 03/05/2013 0010   NITRITE NEGATIVE 03/05/2013 0010   LEUKOCYTESUR NEGATIVE 03/05/2013 0010   Sepsis Labs Invalid input(s): PROCALCITONIN,  WBC,  LACTICIDVEN Microbiology Recent Results (from the past 240 hour(s))  Respiratory Panel by RT PCR (Flu A&B, Covid) - Nasopharyngeal Swab     Status: Abnormal   Collection Time: 09/21/20  8:12 PM   Specimen: Nasopharyngeal Swab  Result Value Ref Range Status   SARS Coronavirus 2 by RT PCR  POSITIVE (A) NEGATIVE Final    Comment: RESULT CALLED TO, READ BACK BY AND VERIFIED WITH: Lockie Pares AT 2132 ON 09/21/2020 BY MOSLEY,J (NOTE) SARS-CoV-2 target nucleic acids are DETECTED.  SARS-CoV-2 RNA is generally detectable in upper respiratory specimens  during the acute phase of infection. Positive results are indicative of the presence of the identified virus, but do not rule out bacterial infection or co-infection with other pathogens not detected by the test. Clinical correlation with patient history and other diagnostic information is necessary to determine patient infection status. The expected result is Negative.  Fact Sheet for Patients:  https://www.moore.com/  Fact Sheet for Healthcare Providers: https://www.young.biz/  This test is not yet approved or cleared by the [place] FDA and  has been authorized for detection and/or diagnosis of SARS-CoV-2 by FDA under an Emergency Use Authorization (EUA).  This EUA will  remain in effect (meaning this test  can be used) for the duration of  the COVID-19 declaration under Section 564(b)(1) of the Act, 21 U.S.C. section 360bbb-3(b)(1), unless the authorization is terminated or revoked sooner.      Influenza A by PCR NEGATIVE NEGATIVE Final   Influenza B by PCR NEGATIVE NEGATIVE Final    Comment: (NOTE) The Xpert Xpress SARS-CoV-2/FLU/RSV assay is intended as an aid in  the diagnosis of influenza from Nasopharyngeal swab specimens and  should not be used as a sole basis for treatment. Nasal washings and  aspirates are unacceptable for Xpert Xpress SARS-CoV-2/FLU/RSV  testing.  Fact Sheet for Patients: https://www.moore.com/  Fact Sheet for Healthcare Providers: https://www.young.biz/  This test is not yet approved or cleared by the [place] FDA and  has been authorized for detection and/or diagnosis of SARS-CoV-2 by  FDA under an  Emergency Use Authorization (EUA). This EUA will remain  in effect (meaning this test can be used) for the duration of the  Covid-19 declaration under Section 564(b)(1) of the Act, 21  U.S.C. section 360bbb-3(b)(1), unless the authorization is  terminated or revoked. Performed at [facility], [address] [place], [place] [address]   Blood Culture (routine x 2)     Status: None (Preliminary result)   Collection Time: 09/21/20  8:15 PM   Specimen: BLOOD  Result Value Ref Range Status   Specimen Description BLOOD  Final   Special Requests NONE  Final   Culture   Final    NO GROWTH 3 DAYS Performed at [facility], [address], [place], [place] [address]    Report Status PENDING  Incomplete  Blood Culture (routine x 2)     Status: None (Preliminary result)   Collection Time: 09/21/20  8:30 PM   Specimen: BLOOD  Result Value Ref Range Status   Specimen Description BLOOD  Final   Special Requests NONE  Final   Culture   Final    NO GROWTH 3 DAYS Performed at [facility], [address], [place], [place] [address]    Report Status PENDING  Incomplete   Time coordinating discharge: 35 mins   SIGNED:  Standley Dakins, MD  [facility] 09/24/2020, 9:00 AM How to contact the [facility] Attending or Consulting provider 7A - 7P or covering provider during after hours 7P -7A, for this patient?  1. Check the care team in [facility] and look for a) attending/consulting [facility] provider listed and b) the [facility] team listed 2. Log into [email or web address] and use [place]'s universal password to access. If you do not have the password, please contact the hospital operator. 3. Locate the [facility] provider you are looking for under [facility] and page to a number that you can be directly reached. 4. If you still have difficulty reaching the provider, please page the [facility] (Director on Call) for the Hospitalists listed on amion for assistance.

## 2020-09-24 NOTE — Progress Notes (Signed)
SATURATION QUALIFICATIONS: (This note is used to comply with regulatory documentation for home oxygen)  Patient Saturations on Room Air at Rest = 87%  Patient Saturations on Room Air while Ambulating = n/a  Patient Saturations on 4 Liters of oxygen while Ambulating = 91%  Please briefly explain why patient needs home oxygen:   Pt's oxygen saturation dropped to 87% on room air at rest.

## 2020-09-24 NOTE — Progress Notes (Signed)
Patient scheduled for outpatient Remdesivir infusions at 1pm on Wednesday 10/20 at [facility]. Please inform the patient to park at [address] [place], [place], as staff will be escorting the patient through the east entrance of the hospital. Appointments take approximately 45 minutes.    There is a wave flag banner located near the entrance on [address]. Turn into this entrance and immediately turn left or right and park in 1 of the 10 designated Covid Infusion Parking spots. There is a phone number on the sign, please call and let the staff know what spot you are in and we will come out and get you. For questions call [phone].  Thanks.

## 2020-09-25 ENCOUNTER — Ambulatory Visit (HOSPITAL_COMMUNITY)
Admit: 2020-09-25 | Discharge: 2020-09-25 | Disposition: A | Discharge status: Home / Self Care | Payer: 59 | Source: Ambulatory Visit | Attending: Pulmonary Disease | Admitting: Pulmonary Disease

## 2020-09-25 DIAGNOSIS — U071 COVID-19: Secondary | ICD-10-CM | POA: Insufficient documentation

## 2020-09-25 DIAGNOSIS — J1282 Pneumonia due to coronavirus disease 2019: Secondary | ICD-10-CM | POA: Diagnosis not present

## 2020-09-25 MED ORDER — DIPHENHYDRAMINE HCL 50 MG/ML IJ SOLN: 50.0000 mg | Freq: Once | INTRAMUSCULAR | Status: DC | PRN

## 2020-09-25 MED ORDER — FAMOTIDINE IN NACL 20-0.9 MG/50ML-% IV SOLN: 20.0000 mg | Freq: Once | INTRAVENOUS | Status: DC | PRN

## 2020-09-25 MED ORDER — SODIUM CHLORIDE 0.9 % IV SOLN
100.0000 mg | Freq: Once | INTRAVENOUS | Status: AC
  Administered 2020-09-25: 100 mg via INTRAVENOUS
  Filled 2020-09-25: qty 20

## 2020-09-25 MED ORDER — SODIUM CHLORIDE 0.9 % IV SOLN: INTRAVENOUS | Status: DC | PRN

## 2020-09-25 MED ORDER — METHYLPREDNISOLONE SODIUM SUCC 125 MG IJ SOLR: 125.0000 mg | Freq: Once | INTRAMUSCULAR | Status: DC | PRN

## 2020-09-25 MED ORDER — ALBUTEROL SULFATE HFA 108 (90 BASE) MCG/ACT IN AERS: 2.0000 | INHALATION_SPRAY | Freq: Once | RESPIRATORY_TRACT | Status: DC | PRN

## 2020-09-25 MED ORDER — EPINEPHRINE 0.3 MG/0.3ML IJ SOAJ: 0.3000 mg | Freq: Once | INTRAMUSCULAR | Status: DC | PRN

## 2020-09-25 NOTE — Progress Notes (Signed)
  Diagnosis: COVID-19  Physician: Delford Field, MD  Procedure: Covid Infusion Clinic Med: remdesivir infusion - Provided patient with remdesivir fact sheet for patients, parents and caregivers prior to infusion.  Complications: No immediate complications noted.  Discharge: Discharged home   [name] 09/25/2020

## 2020-09-25 NOTE — Progress Notes (Signed)
  Diagnosis: COVID-19  Physician: Shan Levans, MD  Procedure: Covid Infusion Clinic Med: remdesivir infusion - Provided patient with remdesivir fact sheet for patients, parents and caregivers prior to infusion.  Complications: No immediate complications noted.  Discharge: Discharged home   Angelia Mould 09/25/2020

## 2020-09-26 ENCOUNTER — Encounter (INDEPENDENT_AMBULATORY_CARE_PROVIDER_SITE_OTHER): Payer: Self-pay | Admitting: *Deleted

## 2020-09-26 LAB — CULTURE, BLOOD (ROUTINE X 2)
Culture: NO GROWTH
Culture: NO GROWTH

## 2021-04-01 ENCOUNTER — Other Ambulatory Visit: Payer: Self-pay | Admitting: Family Medicine

## 2021-04-01 ENCOUNTER — Other Ambulatory Visit (HOSPITAL_COMMUNITY): Payer: Self-pay | Admitting: Family Medicine

## 2021-04-01 DIAGNOSIS — R10A0 Flank pain, unspecified side: Secondary | ICD-10-CM

## 2021-04-01 DIAGNOSIS — R109 Unspecified abdominal pain: Secondary | ICD-10-CM

## 2021-04-03 ENCOUNTER — Encounter: Payer: Self-pay | Admitting: Internal Medicine

## 2021-04-04 ENCOUNTER — Other Ambulatory Visit: Payer: Self-pay

## 2021-04-04 ENCOUNTER — Ambulatory Visit (HOSPITAL_COMMUNITY)
Admission: RE | Admit: 2021-04-04 | Discharge: 2021-04-04 | Disposition: A | Discharge status: Home / Self Care | Payer: No Typology Code available for payment source | Source: Ambulatory Visit | Attending: Family Medicine | Admitting: Family Medicine

## 2021-04-04 DIAGNOSIS — R109 Unspecified abdominal pain: Secondary | ICD-10-CM | POA: Diagnosis present

## 2021-04-10 ENCOUNTER — Encounter: Payer: Self-pay | Admitting: Internal Medicine

## 2021-05-12 ENCOUNTER — Ambulatory Visit: Payer: Self-pay | Admitting: Surgery

## 2021-05-14 ENCOUNTER — Other Ambulatory Visit (HOSPITAL_COMMUNITY): Payer: Self-pay | Admitting: Surgery

## 2021-05-14 DIAGNOSIS — R1013 Epigastric pain: Secondary | ICD-10-CM

## 2021-05-19 ENCOUNTER — Telehealth: Payer: Self-pay | Admitting: Gastroenterology

## 2021-05-19 NOTE — Telephone Encounter (Signed)
Hi Beth, we have received a referral from [facility] for esophageal manometry. I see that pt is scheduled to see Dr. Jena Gauss on 6/30 for other GI sxs. I will send you records over. Thank you.

## 2021-05-20 NOTE — Telephone Encounter (Signed)
Reviewed records from [facility]. Changed referral to GI consult-urgent-dx worsening GERD despite PPI therapy, dysphagia, overdue colon cancer screening. Dr Michaell Cowing wants patient to GI for evaluation.

## 2021-05-21 NOTE — Telephone Encounter (Signed)
Left message to call back to sch consult. ° °

## 2021-05-28 ENCOUNTER — Ambulatory Visit: Payer: No Typology Code available for payment source | Admitting: Gastroenterology

## 2021-06-04 ENCOUNTER — Encounter: Payer: Self-pay | Admitting: Nurse Practitioner

## 2021-06-04 NOTE — Telephone Encounter (Signed)
Patient has been scheduled for 07/04/21

## 2021-06-05 ENCOUNTER — Ambulatory Visit: Payer: No Typology Code available for payment source | Admitting: Internal Medicine

## 2021-06-10 ENCOUNTER — Ambulatory Visit (HOSPITAL_COMMUNITY)
Admission: RE | Admit: 2021-06-10 | Discharge: 2021-06-10 | Disposition: A | Discharge status: Home / Self Care | Payer: No Typology Code available for payment source | Source: Ambulatory Visit | Attending: Surgery | Admitting: Surgery

## 2021-06-10 ENCOUNTER — Other Ambulatory Visit: Payer: Self-pay

## 2021-06-10 DIAGNOSIS — R1013 Epigastric pain: Secondary | ICD-10-CM | POA: Insufficient documentation

## 2021-06-10 MED ORDER — TECHNETIUM TC 99M SULFUR COLLOID
1.9500 | Freq: Once | INTRAVENOUS | Status: AC | PRN
  Administered 2021-06-10: 07:00:00 1.95 via INTRAVENOUS

## 2021-06-11 ENCOUNTER — Other Ambulatory Visit: Payer: Self-pay

## 2021-07-04 ENCOUNTER — Other Ambulatory Visit (INDEPENDENT_AMBULATORY_CARE_PROVIDER_SITE_OTHER): Payer: No Typology Code available for payment source

## 2021-07-04 ENCOUNTER — Encounter: Payer: Self-pay | Admitting: Nurse Practitioner

## 2021-07-04 ENCOUNTER — Ambulatory Visit (INDEPENDENT_AMBULATORY_CARE_PROVIDER_SITE_OTHER): Payer: No Typology Code available for payment source | Admitting: Nurse Practitioner

## 2021-07-04 VITALS — BP 120/82 | HR 76 | Ht 64.0 in | Wt 278.2 lb

## 2021-07-04 DIAGNOSIS — K449 Diaphragmatic hernia without obstruction or gangrene: Secondary | ICD-10-CM

## 2021-07-04 DIAGNOSIS — R109 Unspecified abdominal pain: Secondary | ICD-10-CM | POA: Insufficient documentation

## 2021-07-04 DIAGNOSIS — K5909 Other constipation: Secondary | ICD-10-CM

## 2021-07-04 DIAGNOSIS — Z1211 Encounter for screening for malignant neoplasm of colon: Secondary | ICD-10-CM

## 2021-07-04 DIAGNOSIS — R1033 Periumbilical pain: Secondary | ICD-10-CM

## 2021-07-04 DIAGNOSIS — K219 Gastro-esophageal reflux disease without esophagitis: Secondary | ICD-10-CM | POA: Diagnosis not present

## 2021-07-04 DIAGNOSIS — K5900 Constipation, unspecified: Secondary | ICD-10-CM | POA: Insufficient documentation

## 2021-07-04 LAB — COMPREHENSIVE METABOLIC PANEL
ALT: 15 U/L (ref 0–35)
AST: 16 U/L (ref 0–37)
Albumin: 4.6 g/dL (ref 3.5–5.2)
Alkaline Phosphatase: 72 U/L (ref 39–117)
BUN: 14 mg/dL (ref 6–23)
CO2: 28 mEq/L (ref 19–32)
Calcium: 9.9 mg/dL (ref 8.4–10.5)
Chloride: 103 mEq/L (ref 96–112)
Creatinine, Ser: 0.81 mg/dL (ref 0.40–1.20)
GFR: 83.53 mL/min (ref >60.00)
Glucose, Bld: 101 mg/dL — ABNORMAL HIGH (ref 70–99)
Potassium: 4 mEq/L (ref 3.5–5.1)
Sodium: 139 mEq/L (ref 135–145)
Total Bilirubin: 0.3 mg/dL (ref 0.2–1.2)
Total Protein: 7.2 g/dL (ref 6.0–8.3)

## 2021-07-04 LAB — CBC WITH DIFFERENTIAL/PLATELET
Basophils Absolute: 0.1 10*3/uL (ref 0.0–0.1)
Basophils Relative: 0.7 % (ref 0.0–3.0)
Eosinophils Absolute: 0.1 10*3/uL (ref 0.0–0.7)
Eosinophils Relative: 1.2 % (ref 0.0–5.0)
HCT: 43.4 % (ref 36.0–46.0)
Hemoglobin: 14.8 g/dL (ref 12.0–15.0)
Lymphocytes Relative: 34.4 % (ref 12.0–46.0)
Lymphs Abs: 2.4 10*3/uL (ref 0.7–4.0)
MCHC: 34 g/dL (ref 30.0–36.0)
MCV: 90.6 fl (ref 78.0–100.0)
Monocytes Absolute: 0.4 10*3/uL (ref 0.1–1.0)
Monocytes Relative: 6 % (ref 3.0–12.0)
Neutro Abs: 4 10*3/uL (ref 1.4–7.7)
Neutrophils Relative %: 57.7 % (ref 43.0–77.0)
Platelets: 293 10*3/uL (ref 150.0–400.0)
RBC: 4.79 Mil/uL (ref 3.87–5.11)
RDW: 13.2 % (ref 11.5–15.5)
WBC: 6.9 10*3/uL (ref 4.0–10.5)

## 2021-07-04 NOTE — Patient Instructions (Addendum)
It was my pleasure to provide care to you today. Based on our discussion, I am providing you with my recommendations below:  RECOMMENDATION(S):   I am providing you with a Reflux handout for your review Please continue you Aciphex as prescribed by your PCP Please dissolve 1 capful of Miralax in 8 ounces of water or juice and take every evening before bed Call the office if your symptoms worsen  LABS:   Please proceed to the basement level for lab work before leaving today. Press "B" on the elevator. The lab is located at the first door on the left as you exit the elevator.  HEALTHCARE LAWS AND MY CHART RESULTS:   Due to recent changes in healthcare laws, you may see results of your imaging and/or laboratory studies on MyChart before I have had a chance to review them.  I understand that in some cases there may be results that are confusing or concerning to you. Please understand that not all results are received at same time and often I may need to interpret multiple results in order to provide you with the best plan of care or course of treatment. Therefore, I ask that you please give me 48 hours to thoroughly review all your results before contacting my office for clarification.    COLONOSCOPY AND ENDOSCOPY:   You have been scheduled for an endoscopy and a colonoscopy. Please follow the written instructions given to you at your visit today.  PREP:   Please pick up your prep supplies at the pharmacy within the next 1-3 days.  INHALERS:   If you use inhalers (even only as needed), please bring them with you on the day of your procedure.  COLONOSCOPY TIPS:  To reduce nausea and dehydration, stay well hydrated for 3-4 days prior to the exam.  To prevent skin/hemorrhoid irritation - prior to wiping, put A&Dointment or vaseline on the toilet paper. Keep a towel or pad on the bed.  BEFORE STARTING YOUR PREP, drink  64oz of clear liquids in the morning. This will help to flush the colon  and will ensure you are well hydrated!!!!  NOTE - This is in addition to the fluids required for to complete your prep. Use of a flavored hard candy, such as grape Rubin Payor, can counteract some of the flavor of the prep and may prevent some nausea.    FOLLOW UP:  After your procedure, you will receive a call from my office staff regarding my recommendation for follow up.  BMI:  If you are age 52 or younger, your body mass index should be between 19-25. Your Body mass index is 47.76 kg/m. If this is out of the aformentioned range listed, please consider follow up with your Primary Care Provider.   MY CHART:  The [facility] providers would like to encourage you to use [facility] to communicate with providers for non-urgent requests or questions.  Due to long hold times on the telephone, sending your provider a message by [facility] may be a faster and more efficient way to get a response.  Please allow 48 business hours for a response.  Please remember that this is for non-urgent requests.   Thank you for trusting me with your gastrointestinal care!    Alcide Evener, NP

## 2021-07-04 NOTE — Progress Notes (Signed)
.     07/04/2021 Kathleen Massey [id number] Sep 01, 1971   CHIEF COMPLAINT: GERD, schedule an EGD and  colonoscopy   HISTORY OF PRESENT ILLNESS:   Kathleen Massey is a 50 year old female with a past medical history of obesity, fibromyalgia, gallstones and Covid pneumonia 09/2020.  She presents her office today as referred by Dr. Assunta Found for further evaluation regarding GERD symptoms, abdominal pain and to schedule screening colonoscopy.  She describes having central abdominal pain halfway between the epigastric area and umbilicus for the past 1-1/2 years.  Her abdominal pain is dull and comes and goes it occurs most days.  Eating does not worsen or improve her central abdominal pain.  She has acid reflux symptoms with significant burning in her throat with intermittent dysphagia, felt like food briefly got stuck then passed down the esophagus x 6 months. She also has experienced nighttime acid regurgitation with concerns of aspiration resulting in excessive coughing.  However, since she was started on Rabeprazole 20 mg daily 3 months ago her heartburn has significantly improved, she is no longer having dysphagia or nighttime coughing.  She underwent a CTAP having abdominal and back pain 04/04/2021 which showed a moderate size hiatal hernia, suspected gallstones and a small fat-containing umbilical hernia.  She questions if she needs surgery for her hiatal hernia.  She was having nausea with abdominal bloating.  She underwent a gastric emptying study 06/10/2021 which showed rapid gastric emptying.  She is eating a healthy diet and actively trying to lose weight.  She complains of having constipation for the past 2 years.  She is passing a normal formed brown bowel movement every 3 to 4 days for the past 2 years.  She previously passed a normal formed stool once daily.  She denies seeing any rectal bleeding or black-colored stools.  She scheduled scheduled a colonoscopy sometime in 2020 but procedure was  canceled secondary to the COVID pandemic.  No known family history of esophageal, gastric or colorectal cancer.  CTAP 04/04/2021 without contrast done due to bilateral flank pain/mid lower back pain.: 1. No renal stones or obstructive uropathy. No acute abnormality in the abdomen/pelvis. 2. Moderate-sized hiatal hernia. 3. Suspected gallstones. 4. Small fat containing umbilical hernia. 5. Minimal aortic atherosclerosis.  Aortic Atherosclerosis  Past Medical History:  Diagnosis Date   Headache(784.0)    Ovarian cyst    Past Surgical History:  Procedure Laterality Date   CESAREAN SECTION    Wisdom teeth extraction. She denies having any problems with sedation, anesthesia or airway management with any past procedure or surgery.  She never required intubation.  Social History: She drinks one glass of beer or one glass of wine weekly. No drug use.   Family History: Mother with history of reflux, heart disease, hypertension and stage IV kidney disease.  Father deceased age 72 secondary to Parkinson' disease. No family history of esophageal, gastric or colon cancer.   No Known Allergies    Outpatient Encounter Medications as of 07/04/2021  Medication Sig   RABEprazole (ACIPHEX) 20 MG tablet Take 20 mg by mouth daily.   acetaminophen (TYLENOL) 325 MG tablet Take 2 tablets (650 mg total) by mouth every 6 (six) hours as needed for mild pain or headache (fever >/= 101).   ALPRAZolam (XANAX) 1 MG tablet Take 1 mg by mouth 3 (three) times daily as needed.   cyclobenzaprine (FLEXERIL) 10 MG tablet Take 10 mg by mouth 2 (two) times daily as needed for muscle spasms. (  Patient not taking: Reported on 07/04/2021)   estradiol (ESTRACE) 1 MG tablet Take 1 mg by mouth daily.   famotidine (PEPCID) 20 MG tablet Take 1 tablet (20 mg total) by mouth at bedtime.   guaiFENesin-dextromethorphan (ROBITUSSIN DM) 100-10 MG/5ML syrup Take 10 mLs by mouth every 4 (four) hours as needed for cough.    medroxyPROGESTERone (PROVERA) 5 MG tablet Take 5 mg by mouth daily.   progesterone (PROMETRIUM) 100 MG capsule Take 100 mg by mouth at bedtime.   [DISCONTINUED] ALPRAZolam (XANAX) 0.5 MG tablet Take 0.5 mg by mouth at bedtime as needed.   [DISCONTINUED] zinc sulfate 220 (50 Zn) MG capsule Take 1 capsule (220 mg total) by mouth daily.   No facility-administered encounter medications on file as of 07/04/2021.     REVIEW OF SYSTEMS:  Gen: Denies fever, sweats or chills. No weight loss.  CV: Denies chest pain, palpitations or edema. Resp: Denies cough, shortness of breath of hemoptysis.  GI: See HPI. GU : Denies urinary burning, blood in urine, increased urinary frequency or incontinence. MS: Denies joint pain, muscles aches or weakness. Derm: Denies rash, itchiness, skin lesions or unhealing ulcers. Psych: Denies depression, anxiety or memory loss. Heme: Denies bruising, bleeding. Neuro:  + Headaches.  Endo:  Denies any problems with DM, thyroid or adrenal function.   PHYSICAL EXAM: Ht 5\' 4"  (1.626 m) Comment: height easured without shoes  Wt 278 lb 4 oz (126.2 kg)   BMI 47.76 kg/m  General: 50 year old female in no acute distress. Head: Normocephalic and atraumatic. Eyes:  Sclerae non-icteric, conjunctive pink. Ears: Normal auditory acuity. Mouth: Dentition intact. No ulcers or lesions.  Neck: Supple, no lymphadenopathy or thyromegaly.  Lungs: Clear bilaterally to auscultation without wheezes, crackles or rhonchi. Heart: Regular rate and rhythm. No murmur, rub or gallop appreciated.  Abdomen: Soft, nontender, non distended. No masses. No hepatosplenomegaly. Normoactive bowel sounds x 4 quadrants.  Rectal: Deferred.  Musculoskeletal: Symmetrical with no gross deformities. Skin: Warm and dry. No rash or lesions on visible extremities. Extremities: No edema. Neurological: Alert oriented x 4, no focal deficits.  Psychological:  Alert and cooperative. Normal mood and  affect.  ASSESSMENT AND PLAN:  98.  50 year old female with GERD symptoms, dysphagia which have significantly improved since starting reap resolved 20 mg daily about 3 months ago.  CTAP 03/2021 showed evidence of a moderate size hiatal hernia. -EGD benefits and risks discussed including risk with sedation, risk of bleeding, perforation and infection  -Continue Omeprazole 20 mg p.o. daily -GERD handout -Weight loss recommended  2.  Colon cancer screening -Colonoscopy benefits and risks discussed including risk with sedation, risk of bleeding, perforation and infection   3. Constipation -MiraLAX nightly  4. Central abdominal pain below the epigastrium and above the umbilicus for 1 1/2 years. CTAP 03/2021 without contrast showed a small umbilical hernia is unlikely the cause of her abdominal pain. -I discussed scheduling an abdominal/pelvic CT with oral and IV contrast if her central abdominal pain persists or worsen -MiraLAX as ordered above, if her constipation improves her abdominal pain may also resolve -Dicyclomine deferred for now as this medication may worsen her constipation -Patient will call our office if her abdominal pain worsens -CBC, CMP     CC:  04/2021, MD

## 2021-07-04 NOTE — Progress Notes (Signed)
Agree with the assessment and plan as outlined by Colleen Kennedy-Smith, NP.    Lizabeth Fellner E. Abdoul Encinas, MD [facility]  

## 2021-08-08 ENCOUNTER — Encounter: Payer: Self-pay | Admitting: Gastroenterology

## 2021-08-08 ENCOUNTER — Ambulatory Visit (AMBULATORY_SURGERY_CENTER): Payer: No Typology Code available for payment source | Admitting: Gastroenterology

## 2021-08-08 ENCOUNTER — Other Ambulatory Visit: Payer: Self-pay

## 2021-08-08 VITALS — BP 140/89 | HR 70 | Temp 98.7°F | Resp 13 | Ht 64.0 in | Wt 278.0 lb

## 2021-08-08 DIAGNOSIS — R1033 Periumbilical pain: Secondary | ICD-10-CM

## 2021-08-08 DIAGNOSIS — K2100 Gastro-esophageal reflux disease with esophagitis, without bleeding: Secondary | ICD-10-CM

## 2021-08-08 DIAGNOSIS — K5909 Other constipation: Secondary | ICD-10-CM

## 2021-08-08 DIAGNOSIS — K449 Diaphragmatic hernia without obstruction or gangrene: Secondary | ICD-10-CM

## 2021-08-08 DIAGNOSIS — Z1211 Encounter for screening for malignant neoplasm of colon: Secondary | ICD-10-CM | POA: Diagnosis present

## 2021-08-08 DIAGNOSIS — K317 Polyp of stomach and duodenum: Secondary | ICD-10-CM | POA: Diagnosis not present

## 2021-08-08 DIAGNOSIS — K219 Gastro-esophageal reflux disease without esophagitis: Secondary | ICD-10-CM

## 2021-08-08 MED ORDER — RABEPRAZOLE SODIUM 20 MG PO TBEC: 20.0000 mg | DELAYED_RELEASE_TABLET | Freq: Two times a day (BID) | ORAL | 3 refills | Status: AC

## 2021-08-08 MED ORDER — SODIUM CHLORIDE 0.9 % IV SOLN: 500.0000 mL | Freq: Once | INTRAVENOUS | Status: DC

## 2021-08-08 NOTE — Progress Notes (Signed)
1630 Shawntel Farnworth CRNA relieves [employer]

## 2021-08-08 NOTE — Op Note (Signed)
[facility] Patient Name: Quaniya Damas Procedure Date: 08/08/2021 3:36 PM MRN: [id number] Endoscopist: Lorin Picket E. Tomasa Rand , MD Age: 50 Referring MD:  Date of Birth: 04-Apr-1971 Gender: Female Account #: [id number] Procedure:                Upper GI endoscopy Indications:              Heartburn, Suspected gastro-esophageal reflux                            disease, Nausea with vomiting Medicines:                Monitored Anesthesia Care Procedure:                Pre-Anesthesia Assessment:                           - Prior to the procedure, a History and Physical                            was performed, and patient medications and                            allergies were reviewed. The patient's tolerance of                            previous anesthesia was also reviewed. The risks                            and benefits of the procedure and the sedation                            options and risks were discussed with the patient.                            All questions were answered, and informed consent                            was obtained. Prior Anticoagulants: The patient has                            taken no previous anticoagulant or antiplatelet                            agents. ASA Grade Assessment: III - A patient with                            severe systemic disease. After reviewing the risks                            and benefits, the patient was deemed in                            satisfactory condition to undergo the procedure.  After obtaining informed consent, the endoscope was                            passed under direct vision. Throughout the                            procedure, the patient's blood pressure, pulse, and                            oxygen saturations were monitored continuously. The                            Endoscope was introduced through the mouth, and                            advanced to the second  part of duodenum. The upper                            GI endoscopy was somewhat difficult due to the                            patient's discomfort during the procedure. The                            patient tolerated the procedure. Scope In: Scope Out: Findings:                 The examined portions of the nasopharynx,                            oropharynx and larynx were normal.                           LA Grade C (one or more mucosal breaks continuous                            between tops of 2 or more mucosal folds, less than                            75% circumference) esophagitis with no bleeding was                            found.                           The exam of the esophagus was otherwise normal.                           A large hiatal hernia was present.                           Multiple 2 to 8 mm sessile polyps were found in the  gastric body. These polyps were removed with a cold                            snare. Resection and retrieval were complete.                            Estimated blood loss was minimal.                           The exam of the stomach was otherwise normal.                           The examined duodenum was normal. Complications:            No immediate complications. Estimated Blood Loss:     Estimated blood loss was minimal. Impression:               - The examined portions of the nasopharynx,                            oropharynx and larynx were normal.                           - LA Grade C reflux esophagitis with no bleeding.                           - Large hiatal hernia.                           - Multiple gastric polyps. Resected and retrieved.                           - Normal examined duodenum. Recommendation:           - Patient has a contact number available for                            emergencies. The signs and symptoms of potential                            delayed complications were  discussed with the                            patient. Return to normal activities tomorrow.                            Written discharge instructions were provided to the                            patient.                           - Resume previous diet.                           - Continue present medications. Increase  Rabeprazole to 40 mg PO twice a day                           - Repeat EGD in 8 weeks to assess healing                           - Consider referral to surgery to discuss hernia                            repair vs. gastric bypass Caelum Federici E. Tomasa Randunningham, MD 08/08/2021 4:46:54 PM This report has been signed electronically.

## 2021-08-08 NOTE — Progress Notes (Signed)
Called to room to assist during endoscopic procedure.  Patient ID and intended procedure confirmed with present staff. Received instructions for my participation in the procedure from the performing physician.  

## 2021-08-08 NOTE — Progress Notes (Signed)
Sedate, gd SR, tolerated procedure well, VSS, report to RN 

## 2021-08-08 NOTE — Patient Instructions (Signed)
Increase the aciphex to twice daily instead of once.  Read all of the handouts given to you by your recovery room nurse.  Try to avoid tomatoes and citrus fruits.  Limit your scid intake. Read the labels on soda and processed foods.  YOU HAD AN ENDOSCOPIC PROCEDURE TODAY AT THE [facility]:   Refer to the procedure report that was given to you for any specific questions about what was found during the examination.  If the procedure report does not answer your questions, please call your gastroenterologist to clarify.  If you requested that your care partner not be given the details of your procedure findings, then the procedure report has been included in a sealed envelope for you to review at your convenience later.  YOU SHOULD EXPECT: Some feelings of bloating in the abdomen. Passage of more gas than usual.  Walking can help get rid of the air that was put into your GI tract during the procedure and reduce the bloating. If you had a lower endoscopy (such as a colonoscopy or flexible sigmoidoscopy) you may notice spotting of blood in your stool or on the toilet paper. If you underwent a bowel prep for your procedure, you may not have a normal bowel movement for a few days.  Please Note:  You might notice some irritation and congestion in your nose or some drainage.  This is from the oxygen used during your procedure.  There is no need for concern and it should clear up in a day or so.  SYMPTOMS TO REPORT IMMEDIATELY:  Following lower endoscopy (colonoscopy or flexible sigmoidoscopy):  Excessive amounts of blood in the stool  Significant tenderness or worsening of abdominal pains  Swelling of the abdomen that is new, acute  Fever of 100F or higher  Following upper endoscopy (EGD)  Vomiting of blood or coffee ground material  New chest pain or pain under the shoulder blades  Painful or persistently difficult swallowing  New shortness of breath  Fever of 100F or higher  Black,  tarry-looking stools  For urgent or emergent issues, a gastroenterologist can be reached at any hour by calling [phone]. Do not use MyChart messaging for urgent concerns.    DIET:  We do recommend a small meal at first, but then you may proceed to your regular diet.  Drink plenty of fluids but you should avoid alcoholic beverages for 24 hours.  ACTIVITY:  You should plan to take it easy for the rest of today and you should NOT DRIVE or use heavy machinery until tomorrow (because of the sedation medicines used during the test).    FOLLOW UP: Our staff will call the number listed on your records 48-72 hours following your procedure to check on you and address any questions or concerns that you may have regarding the information given to you following your procedure. If we do not reach you, we will leave a message.  We will attempt to reach you two times.  During this call, we will ask if you have developed any symptoms of COVID 19. If you develop any symptoms (ie: fever, flu-like symptoms, shortness of breath, cough etc.) before then, please call [phone].  If you test positive for Covid 19 in the 2 weeks post procedure, please call and report this information to [place].    If any biopsies were taken you will be contacted by phone or by letter within the next 1-3 weeks.  Please call us at [phone] if you  have not heard about the biopsies in 3 weeks.    SIGNATURES/CONFIDENTIALITY: You and/or your care partner have signed paperwork which will be entered into your electronic medical record.  These signatures attest to the fact that that the information above on your After Visit Summary has been reviewed and is understood.  Full responsibility of the confidentiality of this discharge information lies with you and/or your care-partner.

## 2021-08-08 NOTE — Progress Notes (Signed)
Pt's states no medical or surgical changes since previsit or office visit. VS assessed by [place] ?

## 2021-08-08 NOTE — Op Note (Signed)
[facility] Patient Name: Kathleen Massey Procedure Date: 08/08/2021 4:17 PM MRN: [id number] Endoscopist: Lorin Picket E. Tomasa Rand , MD Age: 50 Referring MD:  Date of Birth: [birth year] Gender: Female Account #: [id number] Procedure:                Colonoscopy Indications:              Screening for colorectal malignant neoplasm, This                            is the patient's first colonoscopy Medicines:                Monitored Anesthesia Care Procedure:                Pre-Anesthesia Assessment:                           - Prior to the procedure, a History and Physical                            was performed, and patient medications and                            allergies were reviewed. The patient's tolerance of                            previous anesthesia was also reviewed. The risks                            and benefits of the procedure and the sedation                            options and risks were discussed with the patient.                            All questions were answered, and informed consent                            was obtained. Prior Anticoagulants: The patient has                            taken no previous anticoagulant or antiplatelet                            agents. ASA Grade Assessment: III - A patient with                            severe systemic disease. After reviewing the risks                            and benefits, the patient was deemed in                            satisfactory condition to undergo the procedure.  After obtaining informed consent, the colonoscope                            was passed under direct vision. Throughout the                            procedure, the patient's blood pressure, pulse, and                            oxygen saturations were monitored continuously. The                            Olympus CF-HQ190L [id number]) Colonoscope was                            introduced through the  anus and advanced to the the                            cecum, identified by appendiceal orifice and                            ileocecal valve. The colonoscopy was performed with                            moderate difficulty due to significant looping.                            Successful completion of the procedure was aided by                            using manual pressure. The patient tolerated the                            procedure well. The quality of the bowel                            preparation was adequate. The ileocecal valve,                            appendiceal orifice, and rectum were photographed. Scope In: 4:18:47 PM Scope Out: 4:37:54 PM Scope Withdrawal Time: 0 hours 8 minutes 42 seconds  Total Procedure Duration: 0 hours 19 minutes 7 seconds  Findings:                 The perianal and digital rectal examinations were                            normal. Pertinent negatives include normal                            sphincter tone and no palpable rectal lesions.                           The colon (entire examined portion) appeared normal.  The retroflexed view of the distal rectum and anal                            verge was normal and showed no anal or rectal                            abnormalities. Complications:            No immediate complications. Estimated Blood Loss:     Estimated blood loss: none. Impression:               - The entire examined colon is normal.                           - The distal rectum and anal verge are normal on                            retroflexion view.                           - No specimens collected. Recommendation:           - Patient has a contact number available for                            emergencies. The signs and symptoms of potential                            delayed complications were discussed with the                            patient. Return to normal activities tomorrow.                             Written discharge instructions were provided to the                            patient.                           - Resume previous diet.                           - Continue present medications.                           - Repeat colonoscopy in 10 years for screening                            purposes. Deforrest Bogle E. Tomasa Rand, MD 08/08/2021 4:49:35 PM This report has been signed electronically.

## 2021-08-08 NOTE — Progress Notes (Signed)
[facility] History and Physical   Primary Care Physician:  Assunta Found, MD   Reason for Procedure:   GERD/dysphagia/abdominal pain, colon cancer screening  Plan:    Diagnostic EGD, screening colonoscopy     HPI: Kathleen Massey is a 50 y.o. female with 1+ year of bothersome GERD symptoms to include heartburn, acid regurgitation and epigastric pain, as well dysphagia.  She takes Aciphex daily and continues to have symptoms most days of the week.  She was noted to have a hiatal hernia on a CT scan.  She also presents for her initial average risk screening colonoscopy.  She has no chronic lower GI symptoms and no family history of colon cancer.   Past Medical History:  Diagnosis Date   Fibromyalgia    Gallstones    Headache(784.0)    Ovarian cyst    Pneumonia     Past Surgical History:  Procedure Laterality Date   CESAREAN SECTION      Prior to Admission medications   Medication Sig Start Date End Date Taking? Authorizing Provider  ALPRAZolam Prudy Feeler) 1 MG tablet Take 1 mg by mouth 3 (three) times daily as needed. 06/23/21  Yes [provider]  ibuprofen (ADVIL) 100 MG tablet Take 100 mg by mouth every 6 (six) hours as needed for fever.   Yes [provider]  RABEprazole (ACIPHEX) 20 MG tablet Take 20 mg by mouth daily.   Yes [provider]  acetaminophen (TYLENOL) 325 MG tablet Take 2 tablets (650 mg total) by mouth every 6 (six) hours as needed for mild pain or headache (fever >/= 101). Patient not taking: Reported on 08/08/2021 09/24/20   Cleora Fleet, MD  cyclobenzaprine (FLEXERIL) 10 MG tablet Take 10 mg by mouth 2 (two) times daily as needed for muscle spasms. Patient not taking: No sig reported 09/18/20   [provider]    Current Outpatient Medications  Medication Sig Dispense Refill   ALPRAZolam (XANAX) 1 MG tablet Take 1 mg by mouth 3 (three) times daily as needed.     ibuprofen (ADVIL) 100 MG tablet Take 100  mg by mouth every 6 (six) hours as needed for fever.     RABEprazole (ACIPHEX) 20 MG tablet Take 20 mg by mouth daily.     acetaminophen (TYLENOL) 325 MG tablet Take 2 tablets (650 mg total) by mouth every 6 (six) hours as needed for mild pain or headache (fever >/= 101). (Patient not taking: Reported on 08/08/2021)     cyclobenzaprine (FLEXERIL) 10 MG tablet Take 10 mg by mouth 2 (two) times daily as needed for muscle spasms. (Patient not taking: No sig reported)     Current Facility-Administered Medications  Medication Dose Route Frequency Provider Last Rate Last Admin   0.9 %  sodium chloride infusion  500 mL Intravenous Once Jenel Lucks, MD        Allergies as of 08/08/2021   (No Known Allergies)    Family History  Problem Relation Age of Onset   Heart disease Mother    Kidney failure Mother    Hypotension Father    Parkinson's disease Father    Hypertension Brother    Hypertension Maternal Grandfather    Breast cancer Maternal Aunt    Hypertension Son    Other Neg Hx     Social History   Socioeconomic History   Marital status: Divorced    Spouse name: Not on file   Number of children: 3   Years of  education: 12 grade    Highest education level: Not on file  Occupational History   Occupation: Hospital doctor    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  Tobacco Use   Smoking status: Never   Smokeless tobacco: Never  Vaping Use   Vaping Use: Never used  Substance and Sexual Activity   Alcohol use: Yes    Comment: 1-3 glasses of wine per week   Drug use: No   Sexual activity: Not on file  Other Topics Concern   Not on file  Social History Narrative   Not on file   Social Determinants of Health   Financial Resource Strain: Not on file  Food Insecurity: Not on file  Transportation Needs: Not on file  Physical Activity: Not on file  Stress: Not on file  Social Connections: Not on file  Intimate Partner Violence: Not on file    Review of  Systems: All other review of systems negative except as mentioned in the HPI.  Physical Exam: Vital signs BP 109/72   Pulse 91   Temp 98.7 F (37.1 C) (Skin)   Ht 5\' 4"  (1.626 m)   Wt 278 lb (126.1 kg)   LMP 05/31/2021 (Approximate)   SpO2 96%   BMI 47.72 kg/m   General:   Alert,  Well-developed, well-nourished, pleasant Caucasian female in NAD, MP2 Lungs:  Clear throughout to auscultation.   Heart:  Regular rate and rhythm; no murmurs, clicks, rubs,  or gallops. Abdomen:  Soft, nontender and nondistended. Normal bowel sounds.   Neuro/Psych:  Alert and cooperative. Normal mood and affect. A and O x 3   Nioka Thorington E. 06/02/2021, MD [facility]

## 2021-08-12 ENCOUNTER — Telehealth: Payer: Self-pay | Admitting: *Deleted

## 2021-08-12 NOTE — Telephone Encounter (Signed)
  Follow up Call-  Call back number 08/08/2021  Post procedure Call Back phone  # [phone]  Permission to leave phone message Yes  Some recent data might be hidden     Patient questions:  Do you have a fever, pain , or abdominal swelling? No. Pain Score  0 *  Have you tolerated food without any problems? Yes.    Have you been able to return to your normal activities? Yes.    Do you have any questions about your discharge instructions: Diet   No. Medications  No. Follow up visit  No.  Do you have questions or concerns about your Care? Yes.  -c/o hoarseness after EGD. States she is able to eat and drink without any difficulty. Instructed pt to try gargling with warm salt water to help with her throat and that it should continue to improve over the next few days. Instructed pt to call back if symptoms worsen or do not improve. Pt agreeable to plan of care.   Actions: * If pain score is 4 or above: No action needed, pain <4.  Have you developed a fever since your procedure? no  2.   Have you had an respiratory symptoms (SOB or cough) since your procedure? no  3.   Have you tested positive for COVID 19 since your procedure no  4.   Have you had any family members/close contacts diagnosed with the COVID 19 since your procedure?  no   If yes to any of these questions please route to Laverna Peace, RN and Karlton Lemon, RN

## 2021-08-19 ENCOUNTER — Encounter: Payer: Self-pay | Admitting: Gastroenterology

## 2021-09-24 IMAGING — CT CT RENAL STONE PROTOCOL
2 of 4 series · 16 of 46 positions shown, 18 images · non-contrast
Comparison: Renal ultrasound 12/29/2018

CLINICAL DATA: Bilateral flank pain. Mid lower back pain for 8
months.

EXAM:
CT ABDOMEN AND PELVIS WITHOUT CONTRAST
TECHNIQUE: Multidetector CT imaging of the abdomen and pelvis was performed
following the standard protocol without IV contrast.

[Series 2: axial st · axial · 0.91mm/px · z∈[+1024,+1440]mm · 13 of 97 slices shown, 15 images]
[im 7/97  soft-tissue]
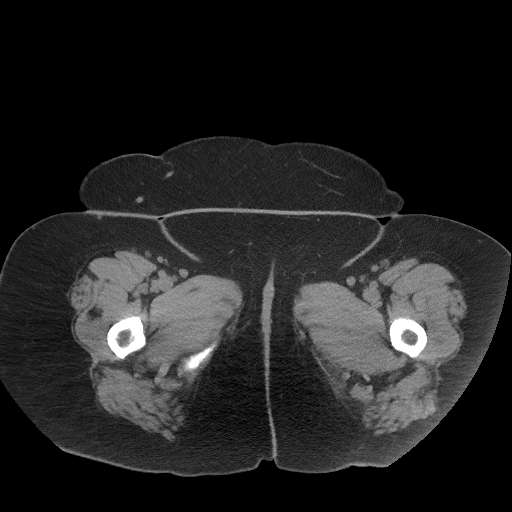
[im 7/97  bone]
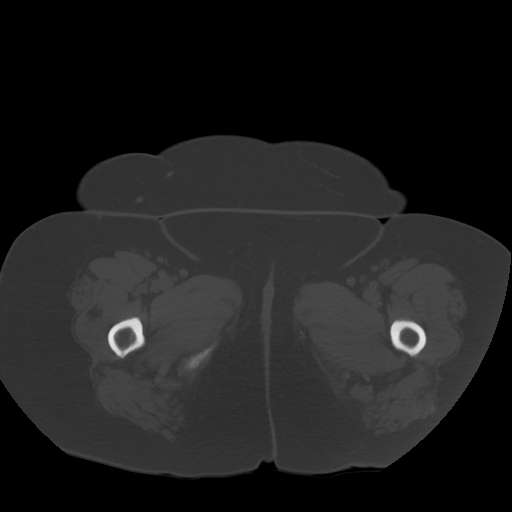
[im 14/97  soft-tissue]
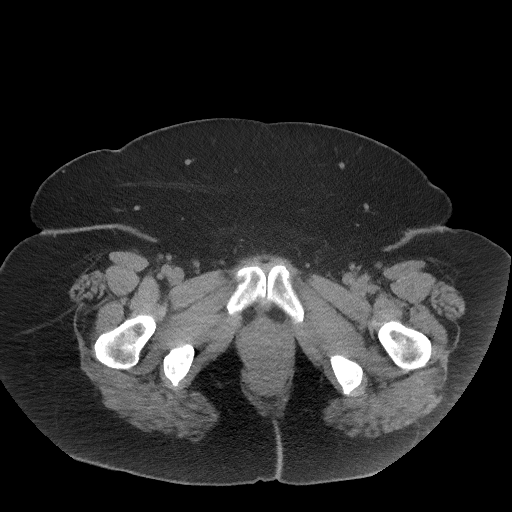
[im 21/97  soft-tissue]
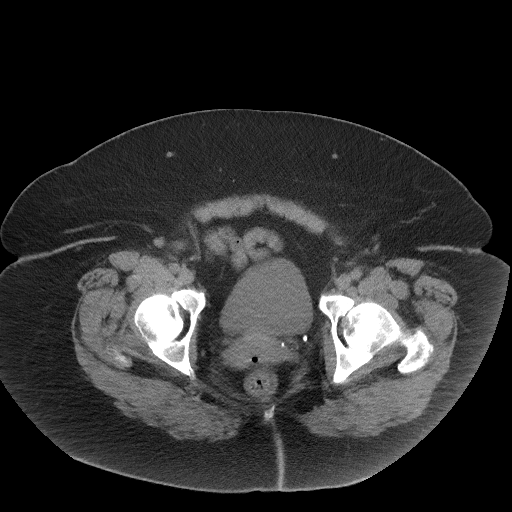
[im 28/97  soft-tissue]
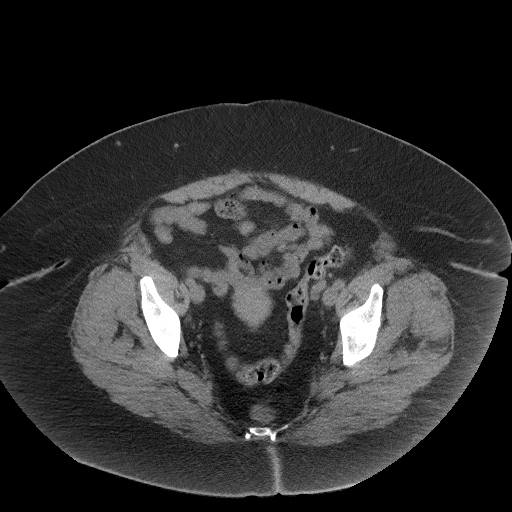
[im 35/97  soft-tissue]
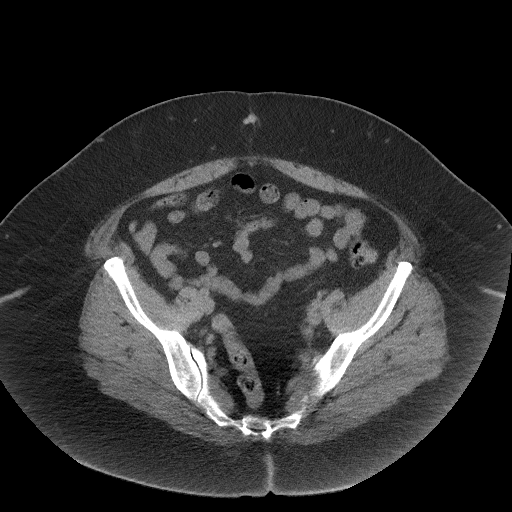
[im 42/97  soft-tissue]
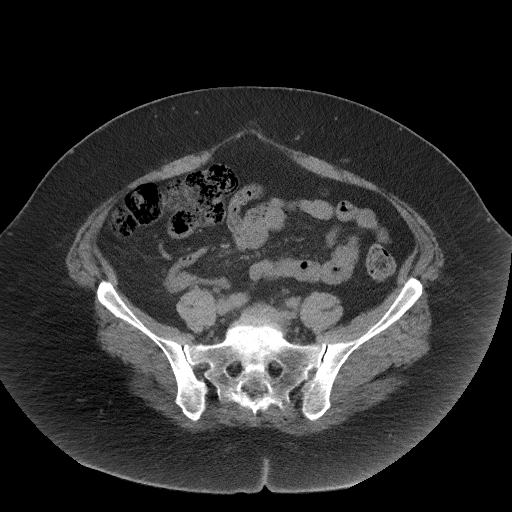
[im 49/97  soft-tissue]
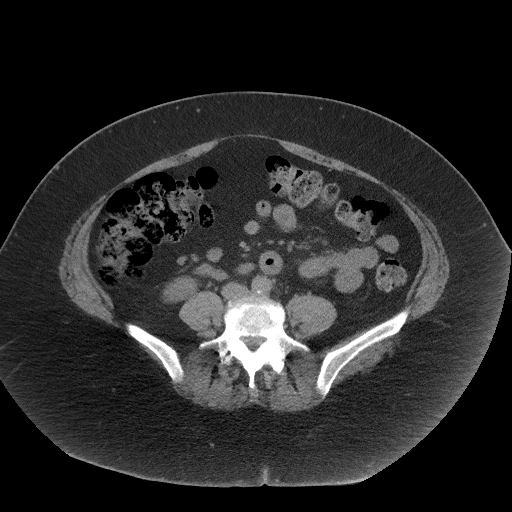
[im 55/97  soft-tissue]
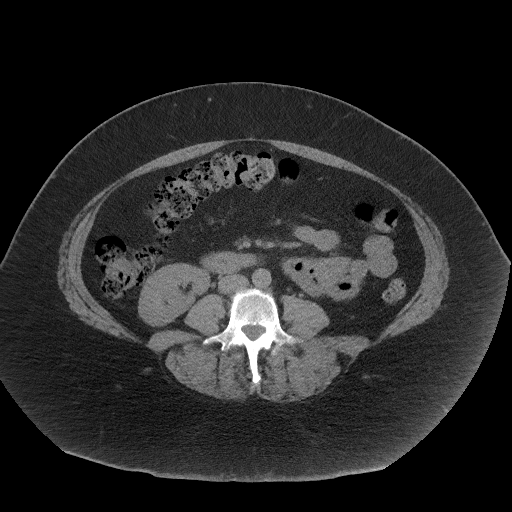
[im 62/97  soft-tissue]
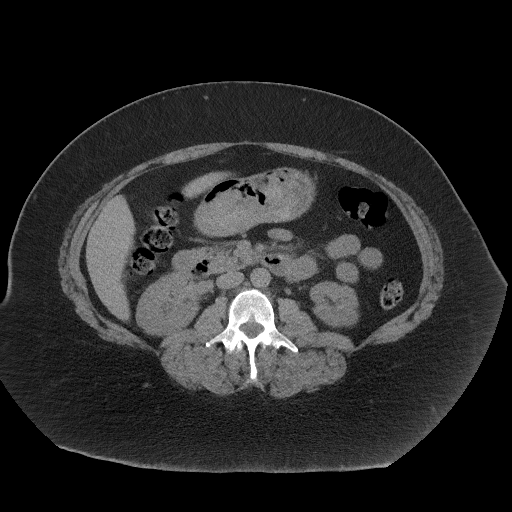
[im 62/97  bone]
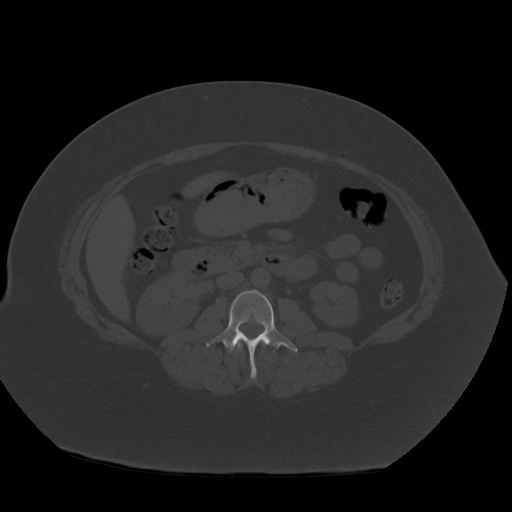
[im 69/97  soft-tissue]
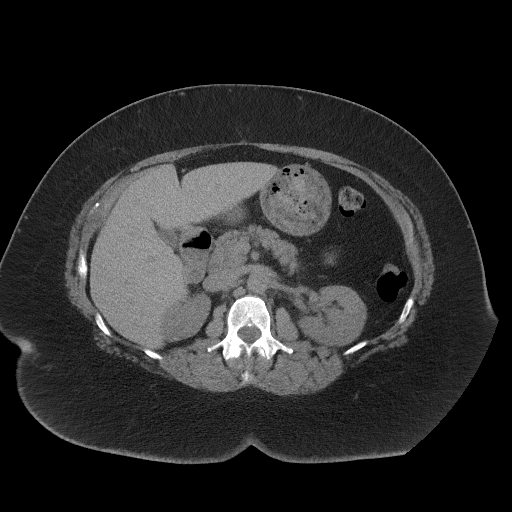
[im 76/97  soft-tissue]
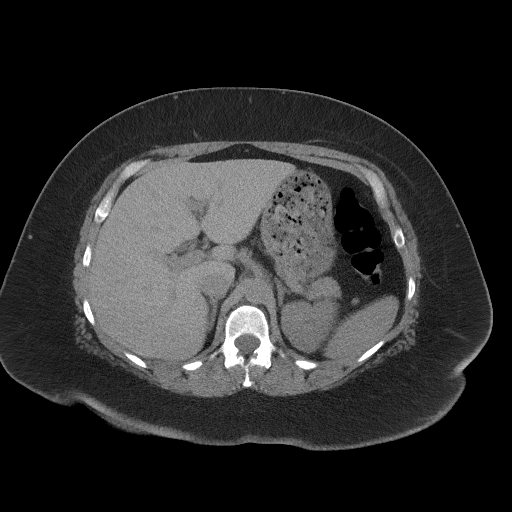
[im 83/97  soft-tissue]
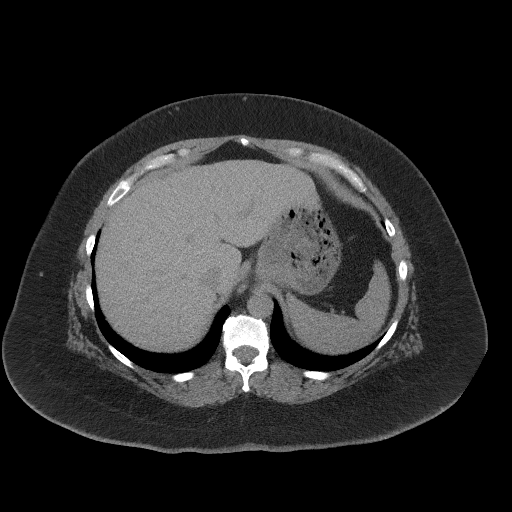
[im 90/97  soft-tissue]
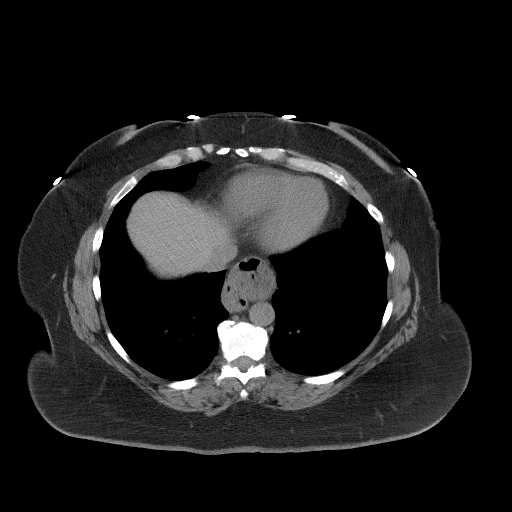

[Series 5: coronal st · coronal · 0.96mm/px · 3 of 131 slices shown]
[im 44/131  soft-tissue]
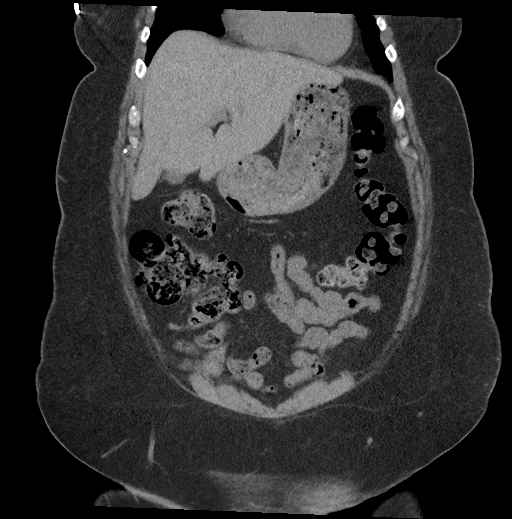
[im 58/131  soft-tissue]
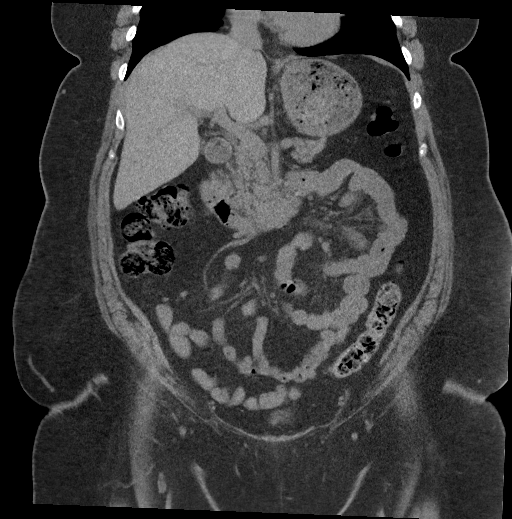
[im 73/131  soft-tissue]
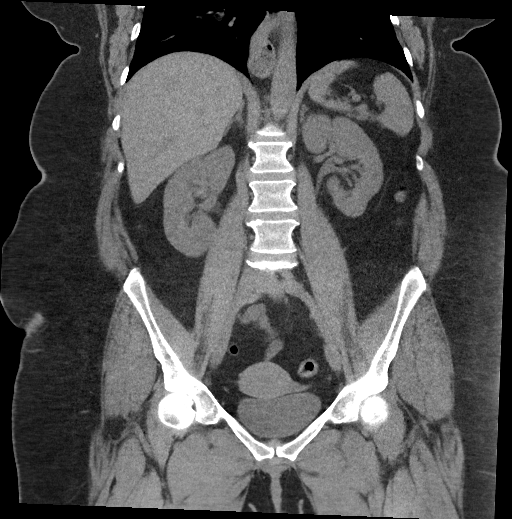

[16 of 46 positions shown; findings below may reference images not displayed]

FINDINGS: Lower chest: The lung bases are clear. No acute airspace disease or
pleural effusion. Heart is normal in size. Moderate-sized hiatal
hernia.

Hepatobiliary: Unremarkable noncontrast appearance of the liver. The
gallbladder is partially distended. Suspected intraluminal
gallstones. No pericholecystic fat stranding. No biliary dilatation.

Pancreas: Unremarkable. No pancreatic ductal dilatation or
surrounding inflammatory changes.

Spleen: Normal in size without focal abnormality.

Adrenals/Urinary Tract: Normal adrenal glands. No hydronephrosis. No
significant perinephric edema. No renal calculi. No evidence of
focal renal lesion. No stones along the course of the ureters.
Urinary bladder is partially distended. No bladder wall thickening
or stone.

Stomach/Bowel: Moderate-sized hiatal hernia. No gastric wall
thickening. There is a small duodenal diverticulum. Small bowel is
otherwise unremarkable. No obstruction or inflammation. Normal
appendix. Moderate stool in the proximal colon with small volume of
stool distally. No colonic wall thickening or inflammatory change.

Vascular/Lymphatic: Minimal aortic atherosclerosis. No aortic
aneurysm. No enlarged lymph nodes in the abdomen or pelvis.

Reproductive: Uterus and bilateral adnexa are unremarkable.

Other: No ascites. Small fat containing umbilical hernia. No bowel
involvement. No inguinal hernia.

Musculoskeletal: Mild degenerative change in the lumbar spine was
prominently at L5-S1 where there is degenerative disc disease and
facet hypertrophy. There are no acute or suspicious osseous
abnormalities.
IMPRESSION: 1. No renal stones or obstructive uropathy. No acute abnormality in
the abdomen/pelvis.
2. Moderate-sized hiatal hernia.
3. Suspected gallstones.
4. Small fat containing umbilical hernia.
5. Minimal aortic atherosclerosis.

Aortic Atherosclerosis (2A6I2-HJV.V).

## 2021-10-13 ENCOUNTER — Encounter: Payer: Self-pay | Admitting: Gastroenterology

## 2021-10-13 ENCOUNTER — Other Ambulatory Visit: Payer: Self-pay

## 2021-10-13 ENCOUNTER — Ambulatory Visit (AMBULATORY_SURGERY_CENTER): Payer: No Typology Code available for payment source | Admitting: Gastroenterology

## 2021-10-13 VITALS — BP 98/62 | HR 60 | Temp 98.4°F | Resp 20 | Ht 64.0 in | Wt 278.0 lb

## 2021-10-13 DIAGNOSIS — K449 Diaphragmatic hernia without obstruction or gangrene: Secondary | ICD-10-CM

## 2021-10-13 DIAGNOSIS — K3189 Other diseases of stomach and duodenum: Secondary | ICD-10-CM | POA: Diagnosis not present

## 2021-10-13 DIAGNOSIS — K2100 Gastro-esophageal reflux disease with esophagitis, without bleeding: Secondary | ICD-10-CM | POA: Diagnosis not present

## 2021-10-13 DIAGNOSIS — K2950 Unspecified chronic gastritis without bleeding: Secondary | ICD-10-CM | POA: Diagnosis not present

## 2021-10-13 DIAGNOSIS — K2970 Gastritis, unspecified, without bleeding: Secondary | ICD-10-CM | POA: Diagnosis not present

## 2021-10-13 DIAGNOSIS — Z8719 Personal history of other diseases of the digestive system: Secondary | ICD-10-CM

## 2021-10-13 MED ORDER — SODIUM CHLORIDE 0.9 % IV SOLN: 500.0000 mL | Freq: Once | INTRAVENOUS | Status: DC

## 2021-10-13 MED ORDER — ESOMEPRAZOLE MAGNESIUM 40 MG PO CPDR: 40.0000 mg | DELAYED_RELEASE_CAPSULE | Freq: Two times a day (BID) | ORAL | 0 refills | Status: DC

## 2021-10-13 NOTE — Progress Notes (Signed)
Called to room to assist during endoscopic procedure.  Patient ID and intended procedure confirmed with present staff. Received instructions for my participation in the procedure from the performing physician.  

## 2021-10-13 NOTE — Op Note (Signed)
[facility] Patient Name: Kathleen Massey Procedure Date: 10/13/2021 10:05 AM MRN: [id number] Endoscopist: Lorin Picket E. Tomasa Rand , MD Age: 50 Referring MD:  Date of Birth: [birth year] Gender: Female Account #: [id number] Procedure:                Upper GI endoscopy Indications:              Follow-up of reflux esophagitis Medicines:                Monitored Anesthesia Care Procedure:                Pre-Anesthesia Assessment:                           - Prior to the procedure, a History and Physical                            was performed, and patient medications and                            allergies were reviewed. The patient's tolerance of                            previous anesthesia was also reviewed. The risks                            and benefits of the procedure and the sedation                            options and risks were discussed with the patient.                            All questions were answered, and informed consent                            was obtained. Prior Anticoagulants: The patient has                            taken no previous anticoagulant or antiplatelet                            agents. ASA Grade Assessment: III - A patient with                            severe systemic disease. After reviewing the risks                            and benefits, the patient was deemed in                            satisfactory condition to undergo the procedure.                           After obtaining informed consent, the endoscope was  passed under direct vision. Throughout the                            procedure, the patient's blood pressure, pulse, and                            oxygen saturations were monitored continuously. The                            GIF [id number] #[id number] was introduced through the                            mouth, and advanced to the third part of duodenum.                            The upper GI  endoscopy was accomplished without                            difficulty. The patient tolerated the procedure                            well. Scope In: Scope Out: Findings:                 The examined portions of the nasopharynx,                            oropharynx and larynx were normal.                           LA Grade C (one or more mucosal breaks continuous                            between tops of 2 or more mucosal folds, less than                            75% circumference) esophagitis with no bleeding was                            found. There was some luminal narrowing in the                            distal esophagus.                           The exam of the esophagus was otherwise normal.                           A large hiatal hernia was present.                           Multiple localized erosions with stigmata of recent                            bleeding were found in the gastric  antrum. Biopsies                            were taken with a cold forceps for Helicobacter                            pylori testing. Estimated blood loss was minimal.                           The exam of the stomach was otherwise normal.                           The examined duodenum was normal.                           Multiple 3 to 8 mm sessile polyps were found in the                            gastric body. Complications:            No immediate complications. Estimated Blood Loss:     Estimated blood loss was minimal. Impression:               - The examined portions of the nasopharynx,                            oropharynx and larynx were normal.                           - LA Grade C reflux esophagitis with no bleeding                            with early stricture formation.                           - Large hiatal hernia.                           - Erosive gastropathy with stigmata of recent                            bleeding. Biopsied.                           -  Normal examined duodenum.                           - Multiple gastric polyps, consistent with fundic                            gland polyps. Recommendation:           - Patient has a contact number available for                            emergencies. The signs and symptoms of potential  delayed complications were discussed with the                            patient. Return to normal activities tomorrow.                            Written discharge instructions were provided to the                            patient.                           - Resume previous diet.                           - Use Nexium (esomeprazole) 40 mg PO BID for 8                            weeks. Stop Aciphex. Consider adding Baclofen to                            improve reflux control.                           - Repeat upper endoscopy in 8 weeks to check                            healing. Uriyah Massimo E. Tomasa Rand, MD 10/13/2021 10:52:16 AM This report has been signed electronically.

## 2021-10-13 NOTE — Progress Notes (Signed)
Report given to PACU, vss 

## 2021-10-13 NOTE — Patient Instructions (Signed)
Await pathology results. Repeat upper endoscopy in 8 weeks to check for healing. Resume previous diet.  Use Nexium (esomeprazole) 40 mg twice a day for 8 weeks (stop your Aciphex). Consider adding baclofen to improve reflux control.   YOU HAD AN ENDOSCOPIC PROCEDURE TODAY AT THE [facility]:   Refer to the procedure report that was given to you for any specific questions about what was found during the examination.  If the procedure report does not answer your questions, please call your gastroenterologist to clarify.  If you requested that your care partner not be given the details of your procedure findings, then the procedure report has been included in a sealed envelope for you to review at your convenience later.  YOU SHOULD EXPECT: Some feelings of bloating in the abdomen. Passage of more gas than usual.  Walking can help get rid of the air that was put into your GI tract during the procedure and reduce the bloating. If you had a lower endoscopy (such as a colonoscopy or flexible sigmoidoscopy) you may notice spotting of blood in your stool or on the toilet paper. If you underwent a bowel prep for your procedure, you may not have a normal bowel movement for a few days.  Please Note:  You might notice some irritation and congestion in your nose or some drainage.  This is from the oxygen used during your procedure.  There is no need for concern and it should clear up in a day or so.  SYMPTOMS TO REPORT IMMEDIATELY:  Following upper endoscopy (EGD)  Vomiting of blood or coffee ground material  New chest pain or pain under the shoulder blades  Painful or persistently difficult swallowing  New shortness of breath  Fever of 100F or higher  Black, tarry-looking stools  For urgent or emergent issues, a gastroenterologist can be reached at any hour by calling [phone]. Do not use MyChart messaging for urgent concerns.    DIET:  We do recommend a small meal at first, but then  you may proceed to your regular diet.  Drink plenty of fluids but you should avoid alcoholic beverages for 24 hours.  ACTIVITY:  You should plan to take it easy for the rest of today and you should NOT DRIVE or use heavy machinery until tomorrow (because of the sedation medicines used during the test).    FOLLOW UP: Our staff will call the number listed on your records 48-72 hours following your procedure to check on you and address any questions or concerns that you may have regarding the information given to you following your procedure. If we do not reach you, we will leave a message.  We will attempt to reach you two times.  During this call, we will ask if you have developed any symptoms of COVID 19. If you develop any symptoms (ie: fever, flu-like symptoms, shortness of breath, cough etc.) before then, please call [phone].  If you test positive for Covid 19 in the 2 weeks post procedure, please call and report this information to [place].    If any biopsies were taken you will be contacted by phone or by letter within the next 1-3 weeks.  Please call us at [phone] if you have not heard about the biopsies in 3 weeks.    SIGNATURES/CONFIDENTIALITY: You and/or your care partner have signed paperwork which will be entered into your electronic medical record.  These signatures attest to the fact that that the information above on your  After Visit Summary has been reviewed and is understood.  Full responsibility of the confidentiality of this discharge information lies with you and/or your care-partner.

## 2021-10-13 NOTE — Progress Notes (Signed)
[facility] History and Physical   Primary Care Physician:  Assunta Found, MD   Reason for Procedure:   Assess healing of reflux esophagitis  Plan:    EGD     HPI: Kathleen Massey is a 50 y.o. female with chronic GERD symptoms despite PPI, found to have a large hiatal hernia and LA Grade C esophagitis on EGD in September.  Her Aciphex was increased to twice daily and she reports some improvement in her symptoms, although she says twice daily Aciphex caused bony pain which limited her ability to take it consistently.   Past Medical History:  Diagnosis Date   Arthritis    Fibromyalgia    Gallstones    GERD (gastroesophageal reflux disease)    Headache(784.0)    Ovarian cyst    Pneumonia     Past Surgical History:  Procedure Laterality Date   CESAREAN SECTION      Prior to Admission medications   Medication Sig Start Date End Date Taking? Authorizing Provider  acetaminophen (TYLENOL) 325 MG tablet Take 2 tablets (650 mg total) by mouth every 6 (six) hours as needed for mild pain or headache (fever >/= 101). Patient not taking: No sig reported 09/24/20   Cleora Fleet, MD  ALPRAZolam Prudy Feeler) 1 MG tablet Take 1 mg by mouth 3 (three) times daily as needed. 06/23/21   [provider]  cyclobenzaprine (FLEXERIL) 10 MG tablet Take 10 mg by mouth 2 (two) times daily as needed for muscle spasms. Patient not taking: No sig reported 09/18/20   [provider]  ibuprofen (ADVIL) 100 MG tablet Take 100 mg by mouth every 6 (six) hours as needed for fever.    [provider]  RABEprazole (ACIPHEX) 20 MG tablet Take 1 tablet (20 mg total) by mouth 2 (two) times daily before a meal. 08/08/21   Jenel Lucks, MD    Current Outpatient Medications  Medication Sig Dispense Refill   acetaminophen (TYLENOL) 325 MG tablet Take 2 tablets (650 mg total) by mouth every 6 (six) hours as needed for mild pain or headache (fever >/= 101). (Patient not  taking: No sig reported)     ALPRAZolam (XANAX) 1 MG tablet Take 1 mg by mouth 3 (three) times daily as needed.     cyclobenzaprine (FLEXERIL) 10 MG tablet Take 10 mg by mouth 2 (two) times daily as needed for muscle spasms. (Patient not taking: No sig reported)     ibuprofen (ADVIL) 100 MG tablet Take 100 mg by mouth every 6 (six) hours as needed for fever.     RABEprazole (ACIPHEX) 20 MG tablet Take 1 tablet (20 mg total) by mouth 2 (two) times daily before a meal. 90 tablet 3   Current Facility-Administered Medications  Medication Dose Route Frequency Provider Last Rate Last Admin   0.9 %  sodium chloride infusion  500 mL Intravenous Once Jenel Lucks, MD        Allergies as of 10/13/2021   (No Known Allergies)    Family History  Problem Relation Age of Onset   Heart disease Mother    Kidney failure Mother    Hypotension Father    Parkinson's disease Father    Hypertension Brother    Breast cancer Maternal Aunt    Hypertension Maternal Grandfather    Hypertension Son    Other Neg Hx    Colon cancer Neg Hx    Colon polyps Neg Hx    Esophageal cancer Neg Hx  Stomach cancer Neg Hx    Rectal cancer Neg Hx     Social History   Socioeconomic History   Marital status: Divorced    Spouse name: Not on file   Number of children: 3   Years of education: 12 grade    Highest education level: Not on file  Occupational History   Occupation: Hospital doctor    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  Tobacco Use   Smoking status: Never   Smokeless tobacco: Never  Vaping Use   Vaping Use: Never used  Substance and Sexual Activity   Alcohol use: Yes    Comment: 1-3 glasses of wine per week   Drug use: No   Sexual activity: Not on file  Other Topics Concern   Not on file  Social History Narrative   Not on file   Social Determinants of Health   Financial Resource Strain: Not on file  Food Insecurity: Not on file  Transportation Needs: Not on file   Physical Activity: Not on file  Stress: Not on file  Social Connections: Not on file  Intimate Partner Violence: Not on file    Review of Systems:  All other review of systems negative except as mentioned in the HPI.  Physical Exam: Vital signs BP (!) 149/83   Pulse 74   Temp 98.4 F (36.9 C)   Resp 18   Ht 5\' 4"  (1.626 m)   Wt 278 lb (126.1 kg)   LMP 05/31/2021 (Approximate)   SpO2 100%   BMI 47.72 kg/m   General:   Alert,  Well-developed, well-nourished, pleasant and cooperative in NAD.  MP2 Lungs:  Clear throughout to auscultation.   Heart:  Regular rate and rhythm; no murmurs, clicks, rubs,  or gallops. Abdomen:  Soft, nontender and nondistended. Normal bowel sounds.   Neuro/Psych:  Normal mood and affect. A and O x 3   Terri Malerba E. 06/02/2021, MD [facility]

## 2021-10-13 NOTE — Progress Notes (Signed)
Check-in-aer V/s-dt 

## 2021-10-13 NOTE — Progress Notes (Signed)
1009 Robinul 0.1 mg IV given due large amount of secretions upon assessment.  MD made aware, vss  

## 2021-10-15 ENCOUNTER — Telehealth: Payer: Self-pay

## 2021-10-15 NOTE — Telephone Encounter (Signed)
  Follow up Call-  Call back number 10/13/2021 08/08/2021  Post procedure Call Back phone  # [phone]  Permission to leave phone message Yes Yes  Some recent data might be hidden     Patient questions:  Do you have a fever, pain , or abdominal swelling? No. Pain Score  0 *  Have you tolerated food without any problems? Yes.    Have you been able to return to your normal activities? Yes.    Do you have any questions about your discharge instructions: Diet   No. Medications  No. Follow up visit  No.  Do you have questions or concerns about your Care? No.  Actions: * If pain score is 4 or above: No action needed, pain <4.

## 2021-10-21 NOTE — Progress Notes (Signed)
Kathleen Massey,  The biopsies taken from your stomach were notable for mild chronic gastritis (inflammation) which is a common finding, but there was no evidence of Helicobacter pylori infection. This is a common finding and there is no specific treatment or further evaluation recommended.  You still need to repeat upper endoscopy in 8 weeks to assess healing of your reflux esophagitis.  Continue the Nexium twice a day as directed.

## 2021-12-18 ENCOUNTER — Other Ambulatory Visit (HOSPITAL_COMMUNITY): Payer: Self-pay | Admitting: Family Medicine

## 2021-12-18 ENCOUNTER — Other Ambulatory Visit: Payer: Self-pay | Admitting: Family Medicine

## 2021-12-18 DIAGNOSIS — M5090 Cervical disc disorder, unspecified, unspecified cervical region: Secondary | ICD-10-CM

## 2022-01-06 ENCOUNTER — Ambulatory Visit (HOSPITAL_COMMUNITY): Payer: No Typology Code available for payment source

## 2022-01-06 ENCOUNTER — Encounter (HOSPITAL_COMMUNITY): Payer: Self-pay

## 2022-02-06 ENCOUNTER — Telehealth: Payer: Self-pay | Admitting: Gastroenterology

## 2022-02-06 ENCOUNTER — Other Ambulatory Visit: Payer: Self-pay

## 2022-02-06 DIAGNOSIS — Z8719 Personal history of other diseases of the digestive system: Secondary | ICD-10-CM

## 2022-02-06 DIAGNOSIS — K2100 Gastro-esophageal reflux disease with esophagitis, without bleeding: Secondary | ICD-10-CM

## 2022-02-06 DIAGNOSIS — K3189 Other diseases of stomach and duodenum: Secondary | ICD-10-CM

## 2022-02-06 MED ORDER — ESOMEPRAZOLE MAGNESIUM 40 MG PO CPDR: 40.0000 mg | DELAYED_RELEASE_CAPSULE | Freq: Two times a day (BID) | ORAL | 0 refills | Status: DC

## 2022-02-06 NOTE — Telephone Encounter (Signed)
Refill sent to [facility] [place] [address] [id number]. ?

## 2022-02-06 NOTE — Telephone Encounter (Signed)
Inbound call from patient stated that she needs a refill for Nexium.  ? ?Also stated that she would like it sent to: ? ?[facility]  ?[address] ? [place] [address] ? ?[phone] ?

## 2022-02-19 ENCOUNTER — Other Ambulatory Visit (HOSPITAL_COMMUNITY): Payer: Self-pay | Admitting: Family Medicine

## 2022-05-18 ENCOUNTER — Other Ambulatory Visit: Payer: Self-pay

## 2022-05-18 ENCOUNTER — Telehealth: Payer: Self-pay | Admitting: Gastroenterology

## 2022-05-18 DIAGNOSIS — Z8719 Personal history of other diseases of the digestive system: Secondary | ICD-10-CM

## 2022-05-18 DIAGNOSIS — K2100 Gastro-esophageal reflux disease with esophagitis, without bleeding: Secondary | ICD-10-CM

## 2022-05-18 DIAGNOSIS — K3189 Other diseases of stomach and duodenum: Secondary | ICD-10-CM

## 2022-05-18 MED ORDER — ESOMEPRAZOLE MAGNESIUM 40 MG PO CPDR: 40.0000 mg | DELAYED_RELEASE_CAPSULE | Freq: Two times a day (BID) | ORAL | 0 refills | Status: DC

## 2022-05-18 NOTE — Telephone Encounter (Signed)
Inbound call from patient stating that she needs a refill for  Nexium. Please advise.

## 2022-05-22 NOTE — Telephone Encounter (Signed)
Requesting the script to go to [place] on [address] in [place]. Tells me she requested this when she called earlier in the week.

## 2022-05-22 NOTE — Telephone Encounter (Signed)
I called Kathleen Massey and told her Maya sent it in 05/18/22 to the [facility]. She will go there and get it. I have taken that pharmacy out of her chart and she will use the [facility] for her local pharmacy in the future.

## 2023-01-03 ENCOUNTER — Other Ambulatory Visit: Payer: Self-pay | Admitting: Gastroenterology

## 2023-01-03 DIAGNOSIS — K3189 Other diseases of stomach and duodenum: Secondary | ICD-10-CM

## 2023-01-03 DIAGNOSIS — K2100 Gastro-esophageal reflux disease with esophagitis, without bleeding: Secondary | ICD-10-CM

## 2023-01-03 DIAGNOSIS — Z8719 Personal history of other diseases of the digestive system: Secondary | ICD-10-CM

## 2023-06-01 ENCOUNTER — Other Ambulatory Visit: Payer: Self-pay | Admitting: Gastroenterology

## 2023-06-01 DIAGNOSIS — Z8719 Personal history of other diseases of the digestive system: Secondary | ICD-10-CM

## 2023-06-01 DIAGNOSIS — K2100 Gastro-esophageal reflux disease with esophagitis, without bleeding: Secondary | ICD-10-CM

## 2023-06-01 DIAGNOSIS — K3189 Other diseases of stomach and duodenum: Secondary | ICD-10-CM

## 2023-08-27 ENCOUNTER — Other Ambulatory Visit: Payer: Self-pay | Admitting: Gastroenterology

## 2023-08-27 DIAGNOSIS — K3189 Other diseases of stomach and duodenum: Secondary | ICD-10-CM

## 2023-08-27 DIAGNOSIS — K2100 Gastro-esophageal reflux disease with esophagitis, without bleeding: Secondary | ICD-10-CM

## 2023-08-27 DIAGNOSIS — Z8719 Personal history of other diseases of the digestive system: Secondary | ICD-10-CM

## 2023-10-05 ENCOUNTER — Other Ambulatory Visit: Payer: Self-pay | Admitting: Gastroenterology

## 2023-10-05 DIAGNOSIS — Z8719 Personal history of other diseases of the digestive system: Secondary | ICD-10-CM

## 2023-10-05 DIAGNOSIS — K3189 Other diseases of stomach and duodenum: Secondary | ICD-10-CM

## 2023-10-05 DIAGNOSIS — K2100 Gastro-esophageal reflux disease with esophagitis, without bleeding: Secondary | ICD-10-CM
